# Patient Record
Sex: Female | Born: 1970 | Race: White | Hispanic: No | Smoking: Never smoker
Health system: Southern US, Community
[De-identification: ages and names within clinical notes are randomized; demographics above are authoritative.]

## PROBLEM LIST (undated history)

## (undated) DIAGNOSIS — N750 Cyst of Bartholin's gland: Secondary | ICD-10-CM

## (undated) DIAGNOSIS — I10 Essential (primary) hypertension: Secondary | ICD-10-CM

## (undated) DIAGNOSIS — K5909 Other constipation: Secondary | ICD-10-CM

## (undated) DIAGNOSIS — Z87898 Personal history of other specified conditions: Secondary | ICD-10-CM

## (undated) DIAGNOSIS — N838 Other noninflammatory disorders of ovary, fallopian tube and broad ligament: Secondary | ICD-10-CM

## (undated) DIAGNOSIS — Z973 Presence of spectacles and contact lenses: Secondary | ICD-10-CM

## (undated) DIAGNOSIS — J302 Other seasonal allergic rhinitis: Secondary | ICD-10-CM

## (undated) HISTORY — PX: BREAST BIOPSY: SHX20

## (undated) HISTORY — PX: WISDOM TOOTH EXTRACTION: SHX21

## (undated) HISTORY — DX: Essential (primary) hypertension: I10

---

## 1997-09-15 ENCOUNTER — Other Ambulatory Visit: Admission: RE | Admit: 1997-09-15 | Discharge: 1997-09-15 | Payer: Self-pay | Admitting: Gynecology

## 1998-09-20 ENCOUNTER — Other Ambulatory Visit: Admission: RE | Admit: 1998-09-20 | Discharge: 1998-09-20 | Payer: Self-pay | Admitting: Gynecology

## 1999-04-23 ENCOUNTER — Inpatient Hospital Stay (HOSPITAL_COMMUNITY): Admission: AD | Admit: 1999-04-23 | Discharge: 1999-04-23 | Payer: Self-pay | Admitting: Obstetrics & Gynecology

## 1999-07-17 ENCOUNTER — Inpatient Hospital Stay (HOSPITAL_COMMUNITY): Admission: AD | Admit: 1999-07-17 | Discharge: 1999-07-20 | Payer: Self-pay | Admitting: Obstetrics and Gynecology

## 1999-11-25 ENCOUNTER — Other Ambulatory Visit: Admission: RE | Admit: 1999-11-25 | Discharge: 1999-11-25 | Payer: Self-pay | Admitting: Obstetrics & Gynecology

## 2000-12-25 ENCOUNTER — Other Ambulatory Visit: Admission: RE | Admit: 2000-12-25 | Discharge: 2000-12-25 | Payer: Self-pay | Admitting: Obstetrics and Gynecology

## 2001-08-28 ENCOUNTER — Emergency Department (HOSPITAL_COMMUNITY): Admission: EM | Admit: 2001-08-28 | Discharge: 2001-08-28 | Payer: Self-pay

## 2001-12-27 ENCOUNTER — Other Ambulatory Visit: Admission: RE | Admit: 2001-12-27 | Discharge: 2001-12-27 | Payer: Self-pay | Admitting: Obstetrics and Gynecology

## 2003-01-12 ENCOUNTER — Other Ambulatory Visit: Admission: RE | Admit: 2003-01-12 | Discharge: 2003-01-12 | Payer: Self-pay | Admitting: Obstetrics and Gynecology

## 2003-08-27 ENCOUNTER — Ambulatory Visit (HOSPITAL_COMMUNITY): Admission: RE | Admit: 2003-08-27 | Discharge: 2003-08-27 | Payer: Self-pay | Admitting: Obstetrics and Gynecology

## 2003-09-26 ENCOUNTER — Inpatient Hospital Stay (HOSPITAL_COMMUNITY): Admission: AD | Admit: 2003-09-26 | Discharge: 2003-09-28 | Payer: Self-pay | Admitting: Obstetrics and Gynecology

## 2004-02-02 ENCOUNTER — Other Ambulatory Visit: Admission: RE | Admit: 2004-02-02 | Discharge: 2004-02-02 | Payer: Self-pay | Admitting: Obstetrics and Gynecology

## 2005-03-08 ENCOUNTER — Other Ambulatory Visit: Admission: RE | Admit: 2005-03-08 | Discharge: 2005-03-08 | Payer: Self-pay | Admitting: Obstetrics and Gynecology

## 2005-12-15 ENCOUNTER — Encounter: Admission: RE | Admit: 2005-12-15 | Discharge: 2005-12-15 | Payer: Self-pay | Admitting: Obstetrics and Gynecology

## 2008-10-15 ENCOUNTER — Encounter: Admission: RE | Admit: 2008-10-15 | Discharge: 2008-10-15 | Payer: Self-pay | Admitting: Obstetrics and Gynecology

## 2009-04-15 ENCOUNTER — Encounter: Admission: RE | Admit: 2009-04-15 | Discharge: 2009-04-15 | Payer: Self-pay | Admitting: Obstetrics and Gynecology

## 2009-11-17 ENCOUNTER — Encounter: Admission: RE | Admit: 2009-11-17 | Discharge: 2009-11-17 | Payer: Self-pay | Admitting: Obstetrics and Gynecology

## 2010-07-11 ENCOUNTER — Other Ambulatory Visit: Payer: Self-pay | Admitting: Obstetrics and Gynecology

## 2010-07-11 DIAGNOSIS — Z09 Encounter for follow-up examination after completed treatment for conditions other than malignant neoplasm: Secondary | ICD-10-CM

## 2010-09-05 ENCOUNTER — Other Ambulatory Visit: Payer: Self-pay | Admitting: Obstetrics and Gynecology

## 2010-09-05 ENCOUNTER — Ambulatory Visit
Admission: RE | Admit: 2010-09-05 | Discharge: 2010-09-05 | Disposition: A | Discharge status: Home / Self Care | Payer: BC Managed Care – PPO | Source: Ambulatory Visit | Attending: Obstetrics and Gynecology | Admitting: Obstetrics and Gynecology

## 2010-09-05 DIAGNOSIS — Z09 Encounter for follow-up examination after completed treatment for conditions other than malignant neoplasm: Secondary | ICD-10-CM

## 2010-09-05 DIAGNOSIS — Z1239 Encounter for other screening for malignant neoplasm of breast: Secondary | ICD-10-CM

## 2010-10-21 NOTE — Op Note (Signed)
[facility]  Patient:    Leslie Pittman, Leslie Pittman                         MRN: [id number] Proc. Date: 07/18/99 Adm. Date:  [id number] Attending:  Leonard Schwartz                           Operative Report  PREOPERATIVE DIAGNOSIS:       Term intrauterine pregnancy.  Arrest of second stage of labor.  Meconium stained fluid.  POSTOPERATIVE DIAGNOSIS:      Term intrauterine pregnancy.  Arrest of second stage of labor.  Meconium stained fluid.  OPERATION:                    Vacuum extraction vaginal delivery.  Repair of bilateral periurethral lacerations.  SURGEON:                      Janine Limbo, M.D.  ASSISTANT:  ANESTHESIA:                   Epidural, 1% Xylocaine.  ESTIMATED BLOOD LOSS:  INDICATIONS:                  Leslie Pittman is a 40 year old female, gravida 1, para 0, who presents at term with rupture of membranes at approximately 6:15 a.m. on  July 17, 1999.  She progressed slowly initially in her labor and her labor as augmented with Pitocin.  The patient became completely dilated and pushed for greater than 2-1/2 hours.  The presenting part was at a +2 to +3 station. Options for care were reviewed including continued pushing, forceps delivery, vacuum-assisted vaginal delivery, and cesarean delivery.  The risks and benefits of each of those options were reviewed.  The patient elected to proceed with vacuum extraction.  The specific risks of vacuum extraction were reviewed including, but not limited to, caput formation, hematoma formation, and the rare risk of intracranial bleeding.  The patient also was informed there was a possibility that vacuum extraction would be unsuccessful and that we would need to proceed with cesarean delivery.  The risks of cesarean section were reviewed including anesthetic complications, bleeding, infections, and possible damage to the surrounding organs.  FINDINGS:                     The  weight of the infant is currently not known.  female infant Leslie Pittman) was delivered with Apgars of 6 at one minute and 8 at five minutes.  There was no meconium noted beneath the vocal cords.  There was a three vessel cord present.  The placenta was normal.  There were bilateral first degree periurethral lacerations present.  There were no perineal lacerations, vaginal lacerations, or cervical lacerations.  DESCRIPTION OF PROCEDURE:     The patient was placed in the lithotomy position.  The bladder had previously been drained of urine.  The perineum, vagina, and lower abdomen were prepped with Betadine.  The patient was sterilely draped.  The Kiwi vacuum extractor was applied without difficulty.  The cervix was completely dilated.  The cervix was 100% effaced and the presenting part was at a +2 to +3  station.  The fetal head was in an occipitoanterior position.  Gentle suction was applied and the fetal head was delivered after several pushes with the mother. The mouth and nose  were suctioned using the DeLee trap and the bulb suction.  No meconium was noted.  The remainder of the infant was delivered and the infant was handed to the awaiting pediatric team.  They checked the vocal cords and there as no meconium noted.  Routine cord blood studies were obtained.  The placenta was  removed.  The lacerations were injected with 1% Xylocaine and then the lacerations were closed using figure-of-eight sutures of 0 chromic.  The upper vagina was inspected and no lacerations were noted.  Hemostasis was noted to be adequate. The infant was allowed to remain in the room with the parents to bond. DD:  07/18/99 TD:  07/18/99 Job: [id number]

## 2010-10-21 NOTE — H&P (Signed)
NAME:  Leslie Pittman, Leslie Pittman                          ACCOUNT NO.:  [id number]   MEDICAL RECORD NO.:  [id number]                   PATIENT TYPE:  [facility]   LOCATION:  [facility]                                 FACILITY:  [facility]   PHYSICIAN:  Leslie Pittman, M.D.             DATE OF BIRTH:  27-Jul-1970   DATE OF ADMISSION:  09/26/2003  DATE OF DISCHARGE:                                HISTORY & PHYSICAL   HISTORY OF PRESENT ILLNESS:  Leslie Pittman is a 40 year old gravida 2 para 1-  0-0-1 at 33 and six-sevenths weeks who presents with spontaneous rupture of  membranes at approximately 2:30 a.m. with clear and pink-tinged fluid noted.  Uterine contractions have been irregular and mild since.  The patient  reports positive fetal movement and denies headache, visual symptoms, or  epigastric pain.  Pregnancy has been remarkable for:  1. Chronic hypertension with the patient on Aldomet 125 mg b.i.d. throughout     her pregnancy with good control.  2. Positive group B strep sensitive to clindamycin.  3. PENICILLIN allergy.  4. Small frame.   PRENATAL LABORATORY DATA:  Blood type is O positive, Rh antibody negative.  VDRL nonreactive.  Rubella titer positive.  Hepatitis B surface antigen  negative.  HIV nonreactive.  GC and chlamydia cultures were negative.  Pap  was normal.  Glucose challenge was normal.  Hemoglobin upon entering the  practice was 13.4; it was 12.4 at 26 weeks.  Quadruple screen was normal.  Group B strep culture was positive at 36 weeks.  GC and chlamydia cultures  were negative.  Glucola was normal.  EDC of September 27, 2003 was established  by last menstrual period and was in agreement with ultrasound at  approximately 18 weeks.   HISTORY OF PRESENT PREGNANCY:  The patient entered care at approximately 10  weeks.  She was on Aldomet 125 b.i.d. at that time.  She had an ultrasound  at 18 weeks that showed normal growth and development.  She had a normal  AFP.  She also had a normal Glucola.   She had lower right-sided pain at 29  weeks.  This ws evaluated and felt to be musculoskeletal.  She continued on  her Aldomet at the same dose throughout her pregnancy with good control of  her blood pressure.  She began NSTs at 32 weeks biweekly and consistently  had reactive tracings.  Beta strep was done at 36 weeks and sensitivities  were determined secondary to the patient's history of PENICILLIN allergy.  The beta strep was sensitive to penicillin and clindamycin, was intermediate  to erythromycin.  She did have a variable at 34 weeks and had a BPP that was  normal.  She had another ultrasound at 35 weeks for size less than dates  that showed normal growth and development.  The rest of her pregnancy was  uncomplicated.  She was scheduled for induction on the  evening of September 28, 2003 secondary to chronic hypertension.   OBSTETRICAL HISTORY:  In 2001 she had a vaginal delivery with a vacuum  assist of a female infant, weight 7 pounds 13 ounces, at 40 weeks.  She was  in labor 21 hours.  She had epidural anesthesia.  Dr. Stefano Pittman did deliver  that child.  She had elevated blood pressure the last week of that pregnancy  and her blood pressure issue did continue.   MEDICAL HISTORY:  She was on oral contraceptives in the past but was stopped  1 year ago secondary to elevated blood pressure.  Her blood pressure was  diagnosed in the last part of her pregnancy and just has continued to be  mildly elevated.  She did have the usual childhood illnesses.  She has had  one UTI.  She was hospitalized for a reaction to SULFA as a child; she was  given that for fever.  She is allergic to PENICILLIN and SULFA.   FAMILY HISTORY:  Paternal uncles had heart disease and hypertension.  Her  maternal grandmother was on medication.  Maternal grandfather is deceased  from emphysema.  Mother is hypothyroid.   GENETIC HISTORY:  Remarkable for the father-of-the-baby's sister having  multiple  sclerosis.   SOCIAL HISTORY:  The patient is married to the father of the baby.  He is  involved and supportive.  His name is Leslie Pittman.  The patient is Caucasian  of the Catholic faith.  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  Her husband is college educated and he is employed as an Health and safety inspector.  She has been followed by the physician service at [facility]  She  denies any alcohol, drug, or tobacco use during this pregnancy.   PHYSICAL EXAMINATION:  VITAL SIGNS:  Blood pressures on admission were  136/87; other vital signs are stable.  HEENT:  Within normal limits.  LUNGS:  Bilateral breath sounds are clear.  HEART:  Regular rate and rhythm without murmur.  BREASTS:  Soft and nontender.  ABDOMEN:  Fundal height is approximately 38 cm.  Estimated fetal weight is 6  to 6.5 pounds.  Uterine contractions are every 2-6 minutes, mild to moderate  quality.  PELVIC:  Sterile speculum exam reveals positive pooling, positive nitrazine,  positive fern.  Cervix is 3 cm, 75%, vertex, at a -1 station.  Fetal heart  rate monitor shows reactive tracing.  There are currently no decelerations.  There was a mild variable associated with one contraction; this did not  reoccur.  Fluid is slightly pink tinged.  EXTREMITIES:  Deep tendon reflexes are 2+ without clonus.  There is a trace  edema noted.   IMPRESSION:  1. Intrauterine pregnancy at 45 and six-sevenths weeks.  2. Spontaneous rupture of membranes in early labor.  3. Positive group B streptococcus with PENICILLIN allergy but group B     streptococcus sensitive to clindamycin.   PLAN:  1. Admit to birthing suite per consult with Dr. Pennie Pittman as attending     physician.  2. Routine physician orders.  3. Plan group B strep prophylaxis with clindamycin.  4. The patient will plan epidural as labor progresses.     Leslie Pittman, C.N.M.                   Leslie Pittman, M.D.   Leslie Pittman  D:  09/26/2003  T:   09/26/2003  Job:  [id number]

## 2010-10-21 NOTE — Discharge Summary (Signed)
[facility]  Patient:    VETRA, SHINALL                         MRN: [id number] Adm. Date:  [id number] Disc. Date: [id number] Attending:  Leonard Schwartz Dictator:   Miguel Dibble, C.N.M.                           Discharge Summary  ADMISSION DIAGNOSES:          1. Intrauterine pregnancy at term.                               2. Primigravida.                               3. Rupture of membranes.                               4. Hypertension in the third trimester.                               5. Positive Group B Strep, allergic to penicillin.                               6. Small frame.  DISCHARGE DIAGNOSES:          1. Intrauterine pregnancy at term.                               2. Primigravida.                               3. Rupture of membranes.                               4. Hypertension in the third trimester.                               5. Positive Group B Strep, allergic to penicillin.                               6. Small frame.                               7. Delivered viable babygirl, Apgars 6 and 8,                                  weight 7 pounds 13 ounces by vacuum extraction.                               8. Meconium stained fluid.  9. Arrested second stage of labor.  PROCEDURE:                    Repair of first degree periurethral laceration, vacuum extraction, Pitocin augmentation, internal fetal monitoring, and amniotomy.  HOSPITAL COURSE:              On July 17, 1999, at approximately 9:76 a.m.,  40 year old Chyenne Sobczak presented with spontaneous rupture of membranes at approximately 6:15 a.m. that was initially pink-tinged, appeared to be fairly clear.  Her blood pressure at admission was 151/88.  She had no symptoms of preeclampsia.  She had had increasing blood pressures in her third trimester. y 10:20 a.m. her blood pressure was 140/94.  An amniotomy was performed at  2:25 p.m. and her cervix was 2 cm, 75% effaced, and vertex at -3.  An amnioinfusion was performed for meconium stained fluid.  Her urinalysis that was performed earlier proved to be negative for protein.  Her blood pressures decreased to 131/77, platelets 214,000.  Her [place] units were only 170.  Pitocin was initiated o augment her labor.  Cervix progressed to 5 cm, 80% effaced by 9:45 p.m. However, she was having a dysfunctional pattern.  At 5 a.m. she delivered by vacuum extraction with arrest of second stage of labor under epidural anesthesia, a viable babygirl, Megan, weighing 7 pounds 13 ounces.  She had first degree bilateral periurethral lacerations that were repaired without difficulty.  On postpartum ay #1, she was breastfeeding well.  Hemoglobin dropped from 14.4 to 9.9, white count was 22.4.  She was afebrile, abdomen soft and nontender, fundus firm 1 below the umbilicus.  She had been treated for anemia during her pregnancy and had iron at home that she would continue.  On her postpartum day #2, she continued to breastfeed well.  Hemoglobin dropped to 8.6.  White count decreased from 22.4 to 19.4.  She was afebrile.  For the previous 24 hours, she was discharged home in  stable condition.  DISCHARGE INSTRUCTIONS:       Per [facility] and Gynecology booklet.  DISCHARGE MEDICATIONS:        1. Iron twice per day.                               2. Prenatal vitamins.                               3. Micronor.                               4. Tylox.                               5. Motrin.  FOLLOW-UP:                    Discharge follow-up in six weeks at [facility] and Gynecology.  DISCHARGE INSTRUCTIONS:       Per [facility] and Gynecology booklet. DD:  07/20/99 TD:  07/20/99 Job: [id number]

## 2010-11-07 ENCOUNTER — Ambulatory Visit
Admission: RE | Admit: 2010-11-07 | Discharge: 2010-11-07 | Disposition: A | Discharge status: Home / Self Care | Payer: BC Managed Care – PPO | Source: Ambulatory Visit | Attending: Obstetrics and Gynecology | Admitting: Obstetrics and Gynecology

## 2010-11-07 DIAGNOSIS — Z1239 Encounter for other screening for malignant neoplasm of breast: Secondary | ICD-10-CM

## 2010-11-08 ENCOUNTER — Other Ambulatory Visit: Payer: Self-pay | Admitting: Obstetrics and Gynecology

## 2010-11-08 DIAGNOSIS — R928 Other abnormal and inconclusive findings on diagnostic imaging of breast: Secondary | ICD-10-CM

## 2010-11-10 ENCOUNTER — Ambulatory Visit
Admission: RE | Admit: 2010-11-10 | Discharge: 2010-11-10 | Disposition: A | Discharge status: Home / Self Care | Payer: BC Managed Care – PPO | Source: Ambulatory Visit | Attending: Obstetrics and Gynecology | Admitting: Obstetrics and Gynecology

## 2010-11-10 DIAGNOSIS — R928 Other abnormal and inconclusive findings on diagnostic imaging of breast: Secondary | ICD-10-CM

## 2011-05-25 ENCOUNTER — Other Ambulatory Visit: Payer: Self-pay | Admitting: *Deleted

## 2011-05-25 ENCOUNTER — Other Ambulatory Visit: Payer: Self-pay | Admitting: Obstetrics and Gynecology

## 2011-05-25 DIAGNOSIS — R921 Mammographic calcification found on diagnostic imaging of breast: Secondary | ICD-10-CM

## 2011-05-25 DIAGNOSIS — Z09 Encounter for follow-up examination after completed treatment for conditions other than malignant neoplasm: Secondary | ICD-10-CM

## 2011-06-12 ENCOUNTER — Other Ambulatory Visit: Payer: Self-pay | Admitting: Obstetrics and Gynecology

## 2011-06-12 ENCOUNTER — Ambulatory Visit
Admission: RE | Admit: 2011-06-12 | Discharge: 2011-06-12 | Disposition: A | Discharge status: Home / Self Care | Payer: BC Managed Care – PPO | Source: Ambulatory Visit | Attending: Obstetrics and Gynecology | Admitting: Obstetrics and Gynecology

## 2011-06-12 DIAGNOSIS — Z09 Encounter for follow-up examination after completed treatment for conditions other than malignant neoplasm: Secondary | ICD-10-CM

## 2011-06-12 DIAGNOSIS — R921 Mammographic calcification found on diagnostic imaging of breast: Secondary | ICD-10-CM

## 2011-06-13 ENCOUNTER — Ambulatory Visit
Admission: RE | Admit: 2011-06-13 | Discharge: 2011-06-13 | Disposition: A | Discharge status: Home / Self Care | Payer: BC Managed Care – PPO | Source: Ambulatory Visit | Attending: Obstetrics and Gynecology | Admitting: Obstetrics and Gynecology

## 2011-06-13 ENCOUNTER — Other Ambulatory Visit: Payer: Self-pay | Admitting: Obstetrics and Gynecology

## 2011-06-13 DIAGNOSIS — R921 Mammographic calcification found on diagnostic imaging of breast: Secondary | ICD-10-CM

## 2011-06-13 DIAGNOSIS — Z09 Encounter for follow-up examination after completed treatment for conditions other than malignant neoplasm: Secondary | ICD-10-CM

## 2011-08-11 ENCOUNTER — Ambulatory Visit (INDEPENDENT_AMBULATORY_CARE_PROVIDER_SITE_OTHER): Payer: BC Managed Care – PPO | Admitting: Internal Medicine

## 2011-08-11 DIAGNOSIS — J329 Chronic sinusitis, unspecified: Secondary | ICD-10-CM

## 2011-08-11 DIAGNOSIS — R05 Cough: Secondary | ICD-10-CM

## 2011-08-11 DIAGNOSIS — R059 Cough, unspecified: Secondary | ICD-10-CM

## 2011-08-11 MED ORDER — BENZONATATE 100 MG PO CAPS: 100.0000 mg | ORAL_CAPSULE | Freq: Two times a day (BID) | ORAL | Status: AC | PRN

## 2011-08-11 MED ORDER — AZITHROMYCIN 500 MG PO TABS: 500.0000 mg | ORAL_TABLET | Freq: Every day | ORAL | Status: AC

## 2011-08-11 MED ORDER — HYDROCOD POLST-CHLORPHEN POLST 10-8 MG/5ML PO LQCR: 5.0000 mL | Freq: Two times a day (BID) | ORAL | Status: DC

## 2011-08-11 NOTE — Progress Notes (Signed)
  Subjective:    Patient ID: Leslie Pittman, female    DOB: 1970/07/31, 41 y.o.   MRN: [id number]  HPI About 3 weeks ago she developed a bad cold with fever. As she got well she began to cough and have morning hoarseness. She also has morning nosebleeds. Now the cough is so bad that she can't sleep at night for the last week and she coughs in paroxysms that were. No fever or night sweats. Her cough is nonproductive.   Review of SystemsOn amlodipine for hypertension     Objective:   Physical ExamVital signs normal TMs clear Nares with bleeding points on both septum and purulent discharge Tender maxillary areas Throat is clear No cervical adenopathy         Assessment & Plan:  Problem #1 sinusitisWith cough Zithromax 500 daily for 5 days Tussionex suspension Tessalon Perles If not well in 10 days recheck

## 2012-06-25 ENCOUNTER — Ambulatory Visit: Payer: BC Managed Care – PPO | Admitting: Obstetrics and Gynecology

## 2012-06-25 ENCOUNTER — Encounter: Payer: Self-pay | Admitting: Obstetrics and Gynecology

## 2012-06-25 VITALS — BP 128/78 | HR 84 | Ht 61.0 in | Wt 122.0 lb

## 2012-06-25 DIAGNOSIS — Z124 Encounter for screening for malignant neoplasm of cervix: Secondary | ICD-10-CM

## 2012-06-25 DIAGNOSIS — N63 Unspecified lump in unspecified breast: Secondary | ICD-10-CM

## 2012-06-25 DIAGNOSIS — Z01419 Encounter for gynecological examination (general) (routine) without abnormal findings: Secondary | ICD-10-CM

## 2012-06-25 NOTE — Progress Notes (Signed)
Subjective:    Leslie Pittman is a 42 y.o. female, G2P0, who presents for an annual exam. The patient reports no complaints. Menstrual cycle:   LMP: Patient's last menstrual period was 06/15/2012.             Review of Systems Pertinent items are noted in HPI. Denies pelvic pain, urinary tract symptoms, vaginitis symptoms, irregular bleeding, menopausal symptoms, change in bowel habits or rectal bleeding   Objective:    BP 128/78  Pulse 84  Ht 5\' 1"  (1.549 m)  Wt 122 lb (55.339 kg)  BMI 23.05 kg/m2  LMP 06/15/2012    Wt Readings from Last 1 Encounters:  06/25/12 122 lb (55.339 kg)   Body mass index is 23.05 kg/(m^2). General Appearance: Alert, no acute distress HEENT: Grossly normal Neck / Thyroid: Supple, no thyromegaly or cervical adenopathy Lungs: Clear to auscultation bilaterally Back: No CVA tenderness Breast Exam: Left Breast nodule, 1 cm x 1 cm mobile, non-tender at 6 o'clock of areola, .No dimpling, nipple retraction or discharge. Cardiovascular: Regular rate and rhythm.  Gastrointestinal: Soft, non-tender, no masses or organomegaly Pelvic Exam: EGBUS-wnl, vagina-normal rugae, cervix- without lesions or tenderness, uterus appears normal size shape and consistency, adnexae-no masses or tenderness Rectovaginal: no masses and normal sphincter tone Lymphatic Exam: Non-palpable nodes in neck, clavicular,  axillary, or inguinal regions  Skin: no rashes or abnormalities Extremities: no clubbing cyanosis or edema  Neurologic: grossly normal Psychiatric: Alert and oriented  Assessment:   Routine GYN Exam Left Breast Nodule S/P Left Breast (UOQ) Biopsy-Fibrocystic Changes Plan:  Diagnostic MG/ultrsound  of left breast   PAP sent  RTO 1 year or prn  Junice Fei,ELMIRAPA-C

## 2012-06-25 NOTE — Progress Notes (Signed)
Regular Periods: yes  Mammogram: yes  Monthly Breast Ex.: no Exercise: yes  Tetanus < 10 years: yes Seatbelts: yes  NI. Bladder Functn.: yes Abuse at home: no  Daily BM's: no Stressful Work: yes  Healthy Diet: yes Sigmoid-Colonoscopy: no  Calcium: no Medical problems this year: none   LAST PAP:2012  Contraception:  spouse had vas  Mammogram:  2/13  nl  PCP: DR. Azucena Cecil  PMH: NO CHANGE  FMH: NO CHANGE  Last Bone Scan: NO  PT IS MARRIED.

## 2012-06-26 LAB — PAP IG W/ RFLX HPV ASCU

## 2012-07-04 ENCOUNTER — Ambulatory Visit
Admission: RE | Admit: 2012-07-04 | Discharge: 2012-07-04 | Disposition: A | Discharge status: Home / Self Care | Payer: BC Managed Care – PPO | Source: Ambulatory Visit | Attending: Obstetrics and Gynecology | Admitting: Obstetrics and Gynecology

## 2012-07-04 DIAGNOSIS — N63 Unspecified lump in unspecified breast: Secondary | ICD-10-CM

## 2014-04-06 ENCOUNTER — Encounter: Payer: Self-pay | Admitting: Obstetrics and Gynecology

## 2014-11-11 ENCOUNTER — Other Ambulatory Visit: Payer: Self-pay

## 2014-11-11 DIAGNOSIS — Z1231 Encounter for screening mammogram for malignant neoplasm of breast: Secondary | ICD-10-CM

## 2014-12-08 ENCOUNTER — Ambulatory Visit: Payer: BC Managed Care – PPO

## 2014-12-21 ENCOUNTER — Other Ambulatory Visit: Payer: Self-pay | Admitting: Obstetrics and Gynecology

## 2014-12-21 DIAGNOSIS — R928 Other abnormal and inconclusive findings on diagnostic imaging of breast: Secondary | ICD-10-CM

## 2014-12-25 ENCOUNTER — Ambulatory Visit
Admission: RE | Admit: 2014-12-25 | Discharge: 2014-12-25 | Disposition: A | Discharge status: Home / Self Care | Payer: BC Managed Care – PPO | Source: Ambulatory Visit | Attending: Obstetrics and Gynecology | Admitting: Obstetrics and Gynecology

## 2014-12-25 DIAGNOSIS — R928 Other abnormal and inconclusive findings on diagnostic imaging of breast: Secondary | ICD-10-CM

## 2015-11-22 ENCOUNTER — Other Ambulatory Visit: Payer: Self-pay | Admitting: Orthopedic Surgery

## 2015-11-22 DIAGNOSIS — M5416 Radiculopathy, lumbar region: Secondary | ICD-10-CM

## 2015-11-30 ENCOUNTER — Ambulatory Visit
Admission: RE | Admit: 2015-11-30 | Discharge: 2015-11-30 | Disposition: A | Discharge status: Home / Self Care | Payer: BC Managed Care – PPO | Source: Ambulatory Visit | Attending: Orthopedic Surgery | Admitting: Orthopedic Surgery

## 2015-11-30 DIAGNOSIS — M5416 Radiculopathy, lumbar region: Secondary | ICD-10-CM

## 2015-11-30 MED ORDER — IOPAMIDOL (ISOVUE-M 200) INJECTION 41%
1.0000 mL | Freq: Once | INTRAMUSCULAR | Status: AC
  Administered 2015-11-30: 1 mL via EPIDURAL

## 2015-11-30 MED ORDER — METHYLPREDNISOLONE ACETATE 40 MG/ML INJ SUSP (RADIOLOG
120.0000 mg | Freq: Once | INTRAMUSCULAR | Status: AC
  Administered 2015-11-30: 120 mg via EPIDURAL

## 2015-11-30 NOTE — Discharge Instructions (Signed)

## 2016-03-20 ENCOUNTER — Other Ambulatory Visit: Payer: Self-pay

## 2016-03-20 DIAGNOSIS — Z1231 Encounter for screening mammogram for malignant neoplasm of breast: Secondary | ICD-10-CM

## 2016-03-22 ENCOUNTER — Other Ambulatory Visit: Payer: Self-pay | Admitting: Obstetrics and Gynecology

## 2016-03-22 DIAGNOSIS — N6322 Unspecified lump in the left breast, upper inner quadrant: Secondary | ICD-10-CM

## 2016-03-29 ENCOUNTER — Other Ambulatory Visit: Payer: BC Managed Care – PPO

## 2016-03-30 ENCOUNTER — Other Ambulatory Visit: Payer: Self-pay | Admitting: Obstetrics and Gynecology

## 2016-03-30 ENCOUNTER — Ambulatory Visit
Admission: RE | Admit: 2016-03-30 | Discharge: 2016-03-30 | Disposition: A | Discharge status: Home / Self Care | Payer: BC Managed Care – PPO | Source: Ambulatory Visit | Attending: Obstetrics and Gynecology | Admitting: Obstetrics and Gynecology

## 2016-03-30 DIAGNOSIS — N6322 Unspecified lump in the left breast, upper inner quadrant: Secondary | ICD-10-CM

## 2018-03-04 ENCOUNTER — Other Ambulatory Visit: Payer: Self-pay | Admitting: Obstetrics and Gynecology

## 2018-03-04 DIAGNOSIS — Z1231 Encounter for screening mammogram for malignant neoplasm of breast: Secondary | ICD-10-CM

## 2018-04-02 ENCOUNTER — Other Ambulatory Visit: Payer: Self-pay | Admitting: Obstetrics and Gynecology

## 2018-04-02 ENCOUNTER — Ambulatory Visit
Admission: RE | Admit: 2018-04-02 | Discharge: 2018-04-02 | Disposition: A | Discharge status: Home / Self Care | Payer: BC Managed Care – PPO | Source: Ambulatory Visit | Attending: Obstetrics and Gynecology | Admitting: Obstetrics and Gynecology

## 2018-04-02 DIAGNOSIS — Z1231 Encounter for screening mammogram for malignant neoplasm of breast: Secondary | ICD-10-CM

## 2018-05-31 ENCOUNTER — Ambulatory Visit: Payer: BC Managed Care – PPO | Admitting: Plastic Surgery

## 2018-05-31 ENCOUNTER — Encounter: Payer: Self-pay | Admitting: Plastic Surgery

## 2018-05-31 DIAGNOSIS — L989 Disorder of the skin and subcutaneous tissue, unspecified: Secondary | ICD-10-CM

## 2018-05-31 NOTE — Progress Notes (Signed)
     Patient ID: Leslie Pittman, female    DOB: 1970-08-03, 47 y.o.   MRN: [id number]   Chief Complaint  Patient presents with  . Skin Problem    The patient is a 47 yrs old wf here for evaluation of a changing skin lesion of her chin. There are two lesions.  One is 7 mm in the center of her chin that is raised and flesh in color.  The other is 5 mm, slightly raised and on the left of center.  They have been present for years but seem to be getting larger.  The larger one has a slight translucent look to it.  She has not had skin cancer in the past. There is a family history of skin cancer in her dad.  She is otherwise in good health.   Review of Systems  Constitutional: Negative.   HENT: Negative.   Eyes: Negative.   Respiratory: Negative.   Cardiovascular: Negative.   Gastrointestinal: Negative.   Endocrine: Negative.   Genitourinary: Negative.   Musculoskeletal: Negative.   Skin: Negative.  Negative for color change and wound.  Hematological: Negative.   Psychiatric/Behavioral: Negative.     Past Medical History:  Diagnosis Date  . Hypertension     Past Surgical History:  Procedure Laterality Date  . BREAST BIOPSY Left   . WISDOM TOOTH EXTRACTION        Current Outpatient Medications:  .  amLODipine (NORVASC) 10 MG tablet, Take 10 mg by mouth daily., Disp: , Rfl:    Objective:   Vitals:   05/31/18 0937  BP: 128/79  Pulse: 71  Temp: 98.1 F (36.7 C)  SpO2: 100%    Physical Exam Vitals signs and nursing note reviewed.  Constitutional:      Appearance: Normal appearance.  HENT:     Head: Normocephalic and atraumatic.     Mouth/Throat:     Mouth: Mucous membranes are moist.  Cardiovascular:     Rate and Rhythm: Normal rate.  Pulmonary:     Effort: Pulmonary effort is normal.  Abdominal:     General: Abdomen is flat. There is no distension.     Tenderness: There is no abdominal tenderness.  Neurological:     Mental Status: She is alert.  Psychiatric:         Mood and Affect: Mood normal.        Thought Content: Thought content normal.        Judgment: Judgment normal.    Assessment & Plan:  Changing skin lesion  Recommed excision of changing skin lesion of chin. We discussed the scar.    [place], DO

## 2018-07-24 ENCOUNTER — Encounter: Payer: Self-pay | Admitting: Internal Medicine

## 2018-08-06 ENCOUNTER — Other Ambulatory Visit: Payer: Self-pay | Admitting: Gynecologic Oncology

## 2018-08-06 ENCOUNTER — Encounter: Payer: Self-pay | Admitting: Gynecologic Oncology

## 2018-08-06 ENCOUNTER — Inpatient Hospital Stay: Payer: BC Managed Care – PPO | Attending: Gynecologic Oncology | Admitting: Gynecologic Oncology

## 2018-08-06 VITALS — BP 147/78 | HR 86 | Temp 98.2°F | Resp 18 | Ht 62.0 in | Wt 113.9 lb

## 2018-08-06 DIAGNOSIS — D3912 Neoplasm of uncertain behavior of left ovary: Secondary | ICD-10-CM | POA: Diagnosis not present

## 2018-08-06 DIAGNOSIS — N838 Other noninflammatory disorders of ovary, fallopian tube and broad ligament: Secondary | ICD-10-CM

## 2018-08-06 DIAGNOSIS — G8918 Other acute postprocedural pain: Secondary | ICD-10-CM

## 2018-08-06 MED ORDER — IBUPROFEN 600 MG PO TABS: 600.0000 mg | ORAL_TABLET | Freq: Four times a day (QID) | ORAL | 0 refills | Status: DC | PRN

## 2018-08-06 MED ORDER — SENNOSIDES-DOCUSATE SODIUM 8.6-50 MG PO TABS: 2.0000 | ORAL_TABLET | Freq: Every day | ORAL | 1 refills | Status: DC

## 2018-08-06 MED ORDER — OXYCODONE HCL 5 MG PO TABS: 5.0000 mg | ORAL_TABLET | ORAL | 0 refills | Status: DC | PRN

## 2018-08-06 NOTE — Progress Notes (Signed)
Consult Note: Gyn-Onc  Consult was requested by Dr. Royston Sinner for the evaluation of Leslie Pittman 48 y.o. female  CC:  Chief Complaint  Patient presents with  . Ovarian mass, left    Assessment/Plan:  Leslie Pittman  is a 48 y.o.  year old with a left sided complex ovarian cyst.  She desires permanent sterility.  I discussed with the patient that there is a high likelihood that this ovarian mass is benign, however given its size and complex appearance on ultrasound, it is reasonable to proceed with surgical evaluation and frozen section.  This would involve robotically assisted left salpingo-oophorectomy possible hysterectomy possible staging.  The patient is also interested in permanent sterilization and therefore we will perform a contralateral right salpingectomy at the time of surgery.  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  I discussed surgical risks with the patient including  bleeding, infection, damage to internal organs (such as bladder,ureters, bowels), blood clot, reoperation and rehospitalization.  I discussed anticipated recovery and hospital stay.  The patient is in agreement with the plan and has surgery scheduled for August 15, 2018.   HPI: Leslie Pittman is a 48 year old P2 who is seen in consultation at the request of Dr Royston Sinner for a complex left ovarian cyst.  The patient was being evaluated by Dr. Royston Sinner for abnormal uterine bleeding.  She had a period of amenorrhea for 2 months followed by an episode of menorrhagia.  This prompted a transvaginal ultrasound which was performed on Fabry 27th 2020.  This revealed a uterus measuring 7.9 x 4 x 5.8 cm with a 2.7 intramural fibroids seen.  There were no right adnexal masses seen.  The left ovary contained a 5.5 x 4.4 x 4.4 cm complex mass with thick septations.  It contained low-level echoes and a couple of solid areas with the largest solid area being 2.2 cm.  A small amount of blood flow  was seen within the cyst.  No free fluid was seen.  Endometrial thickening was seen on the anterior wall of the endometrial cavity following saline infusion into the endometrium.  An endometrial biopsy was performed on Fabry 19 2020 and this revealed proliferative endometrium and benign endocervical mucosa.  A cervical polyp was also biopsy that was benign.  The patient was started on medroxyprogesterone which stopped her bleeding which was only spotting.  A Ca1 25 was drawn in the office on August 01, 2018.  This is not resulted at this time.  The patient is otherwise very healthy.  She has had 2 prior vaginal deliveries and no abdominal surgeries.  Current Meds:  Outpatient Encounter Medications as of 08/06/2018  Medication Sig  . amLODipine (NORVASC) 10 MG tablet Take 10 mg by mouth daily.  . medroxyPROGESTERone (PROVERA) 10 MG tablet Take 10 mg by mouth daily. 4 days left of Taper  . ibuprofen (ADVIL,MOTRIN) 600 MG tablet Take 1 tablet (600 mg total) by mouth every 6 (six) hours as needed for moderate pain. For AFTER surgery  . oxyCODONE (OXY IR/ROXICODONE) 5 MG immediate release tablet Take 1 tablet (5 mg total) by mouth every 4 (four) hours as needed for severe pain. For AFTER surgery, do not take and drive  . senna-docusate (SENOKOT-S) 8.6-50 MG tablet Take 2 tablets by mouth at bedtime. For AFTER surgery, do not take if having loose stools   No facility-administered encounter medications on file as of 08/06/2018.     Allergy:  Allergies  Allergen Reactions  . Sulfa Antibiotics Anaphylaxis and Rash  . Penicillins     Social Hx:   Social History   Socioeconomic History  . Marital status: Married    Spouse name: Not on file  . Number of children: Not on file  . Years of education: Not on file  . Highest education level: Not on file  Occupational History  . Not on file  Social Needs  . Financial resource strain: Not on file  . Food insecurity:    Worry: Not on file     Inability: Not on file  . Transportation needs:    Medical: Not on file    Non-medical: Not on file  Tobacco Use  . Smoking status: Never Smoker  . Smokeless tobacco: Never Used  Substance and Sexual Activity  . Alcohol use: No  . Drug use: No  . Sexual activity: Yes    Birth control/protection: Surgical    Comment: spouse had vas  Lifestyle  . Physical activity:    Days per week: Not on file    Minutes per session: Not on file  . Stress: Not on file  Relationships  . Social connections:    Talks on phone: Not on file    Gets together: Not on file    Attends religious service: Not on file    Active member of club or organization: Not on file    Attends meetings of clubs or organizations: Not on file    Relationship status: Not on file  . Intimate partner violence:    Fear of current or ex partner: Not on file    Emotionally abused: Not on file    Physically abused: Not on file    Forced sexual activity: Not on file  Other Topics Concern  . Not on file  Social History Narrative  . Not on file    Past Surgical Hx:  Past Surgical History:  Procedure Laterality Date  . BREAST BIOPSY Left   . WISDOM TOOTH EXTRACTION      Past Medical Hx:  Past Medical History:  Diagnosis Date  . Hypertension     Past Gynecological History:  SVD x 2. No LMP recorded.  Family Hx:  Family History  Problem Relation Age of Onset  . Thyroid disease Mother   . Hypertension Father   . Hyperlipidemia Father   . Heart attack Paternal Uncle   . Hypertension Maternal Uncle   . Hypertension Maternal Grandmother   . Heart disease Paternal Uncle   . Hypertension Paternal Uncle   . Colon cancer Paternal Grandmother     Review of Systems:  Constitutional  Feels well,    ENT Normal appearing ears and nares bilaterally Skin/Breast  No rash, sores, jaundice, itching, dryness Cardiovascular  No chest pain, shortness of breath, or edema  Pulmonary  No cough or wheeze.  Gastro  Intestinal  No nausea, vomitting, or diarrhoea. No bright red blood per rectum, no abdominal pain, change in bowel movement, or constipation.  Genito Urinary  No frequency, urgency, dysuria, + irregular vaginal bleeding/perimenopausal Musculo Skeletal  No myalgia, arthralgia, joint swelling or pain  Neurologic  No weakness, numbness, change in gait,  Psychology  No depression, anxiety, insomnia.   Vitals:  Blood pressure (!) 147/78, pulse 86, temperature 98.2 F (36.8 C), resp. rate 18, height 5\' 2"  (1.575 m), weight 113 lb 14.4 oz (51.7 kg), SpO2 100 %.  Physical Exam: WD in NAD Neck  Supple NROM, without any enlargements.  Lymph Node Survey No cervical supraclavicular or inguinal adenopathy Cardiovascular  Pulse normal rate, regularity and rhythm. S1 and S2 normal.  Lungs  Clear to auscultation bilateraly, without wheezes/crackles/rhonchi. Good air movement.  Skin  No rash/lesions/breakdown  Psychiatry  Alert and oriented to person, place, and time  Abdomen  Normoactive bowel sounds, abdomen soft, non-tender and thin without evidence of hernia.  Back No CVA tenderness Genito Urinary  Vulva/vagina: Normal external female genitalia.   No lesions. No discharge or bleeding.  Bladder/urethra:  No lesions or masses, well supported bladder  Vagina: norma  Cervix: Normal appearing, no lesions.  Uterus:  Small, mobile, no parametrial involvement or nodularity.  Adnexa: Fullness, mobile, smooth mass in left adnexa. Rectal  Good tone, no masses no cul de sac nodularity.  Extremities  No bilateral cyanosis, clubbing or edema.   Thereasa Solo, MD  08/06/2018, 3:19 PM

## 2018-08-06 NOTE — H&P (View-Only) (Signed)
Consult Note: Gyn-Onc  Consult was requested by Dr. Royston Sinner for the evaluation of Leslie Pittman 48 y.o. female  CC:  Chief Complaint  Patient presents with  . Ovarian mass, left    Assessment/Plan:  Ms. Leslie Pittman  is a 48 y.o.  year old with a left sided complex ovarian cyst.  She desires permanent sterility.  I discussed with the patient that there is a high likelihood that this ovarian mass is benign, however given its size and complex appearance on ultrasound, it is reasonable to proceed with surgical evaluation and frozen section.  This would involve robotically assisted left salpingo-oophorectomy possible hysterectomy possible staging.  The patient is also interested in permanent sterilization and therefore we will perform a contralateral right salpingectomy at the time of surgery.  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  I discussed surgical risks with the patient including  bleeding, infection, damage to internal organs (such as bladder,ureters, bowels), blood clot, reoperation and rehospitalization.  I discussed anticipated recovery and hospital stay.  The patient is in agreement with the plan and has surgery scheduled for August 15, 2018.   HPI: Leslie Pittman is a 48 year old P2 who is seen in consultation at the request of Dr Royston Sinner for a complex left ovarian cyst.  The patient was being evaluated by Dr. Royston Sinner for abnormal uterine bleeding.  She had a period of amenorrhea for 2 months followed by an episode of menorrhagia.  This prompted a transvaginal ultrasound which was performed on Fabry 27th 2020.  This revealed a uterus measuring 7.9 x 4 x 5.8 cm with a 2.7 intramural fibroids seen.  There were no right adnexal masses seen.  The left ovary contained a 5.5 x 4.4 x 4.4 cm complex mass with thick septations.  It contained low-level echoes and a couple of solid areas with the largest solid area being 2.2 cm.  A small amount of blood flow  was seen within the cyst.  No free fluid was seen.  Endometrial thickening was seen on the anterior wall of the endometrial cavity following saline infusion into the endometrium.  An endometrial biopsy was performed on Fabry 19 2020 and this revealed proliferative endometrium and benign endocervical mucosa.  A cervical polyp was also biopsy that was benign.  The patient was started on medroxyprogesterone which stopped her bleeding which was only spotting.  A Ca1 25 was drawn in the office on August 01, 2018.  This is not resulted at this time.  The patient is otherwise very healthy.  She has had 2 prior vaginal deliveries and no abdominal surgeries.  Current Meds:  Outpatient Encounter Medications as of 08/06/2018  Medication Sig  . amLODipine (NORVASC) 10 MG tablet Take 10 mg by mouth daily.  . medroxyPROGESTERone (PROVERA) 10 MG tablet Take 10 mg by mouth daily. 4 days left of Taper  . ibuprofen (ADVIL,MOTRIN) 600 MG tablet Take 1 tablet (600 mg total) by mouth every 6 (six) hours as needed for moderate pain. For AFTER surgery  . oxyCODONE (OXY IR/ROXICODONE) 5 MG immediate release tablet Take 1 tablet (5 mg total) by mouth every 4 (four) hours as needed for severe pain. For AFTER surgery, do not take and drive  . senna-docusate (SENOKOT-S) 8.6-50 MG tablet Take 2 tablets by mouth at bedtime. For AFTER surgery, do not take if having loose stools   No facility-administered encounter medications on file as of 08/06/2018.     Allergy:  Allergies  Allergen Reactions  . Sulfa Antibiotics Anaphylaxis and Rash  . Penicillins     Social Hx:   Social History   Socioeconomic History  . Marital status: Married    Spouse name: Not on file  . Number of children: Not on file  . Years of education: Not on file  . Highest education level: Not on file  Occupational History  . Not on file  Social Needs  . Financial resource strain: Not on file  . Food insecurity:    Worry: Not on file     Inability: Not on file  . Transportation needs:    Medical: Not on file    Non-medical: Not on file  Tobacco Use  . Smoking status: Never Smoker  . Smokeless tobacco: Never Used  Substance and Sexual Activity  . Alcohol use: No  . Drug use: No  . Sexual activity: Yes    Birth control/protection: Surgical    Comment: spouse had vas  Lifestyle  . Physical activity:    Days per week: Not on file    Minutes per session: Not on file  . Stress: Not on file  Relationships  . Social connections:    Talks on phone: Not on file    Gets together: Not on file    Attends religious service: Not on file    Active member of club or organization: Not on file    Attends meetings of clubs or organizations: Not on file    Relationship status: Not on file  . Intimate partner violence:    Fear of current or ex partner: Not on file    Emotionally abused: Not on file    Physically abused: Not on file    Forced sexual activity: Not on file  Other Topics Concern  . Not on file  Social History Narrative  . Not on file    Past Surgical Hx:  Past Surgical History:  Procedure Laterality Date  . BREAST BIOPSY Left   . WISDOM TOOTH EXTRACTION      Past Medical Hx:  Past Medical History:  Diagnosis Date  . Hypertension     Past Gynecological History:  SVD x 2. No LMP recorded.  Family Hx:  Family History  Problem Relation Age of Onset  . Thyroid disease Mother   . Hypertension Father   . Hyperlipidemia Father   . Heart attack Paternal Uncle   . Hypertension Maternal Uncle   . Hypertension Maternal Grandmother   . Heart disease Paternal Uncle   . Hypertension Paternal Uncle   . Colon cancer Paternal Grandmother     Review of Systems:  Constitutional  Feels well,    ENT Normal appearing ears and nares bilaterally Skin/Breast  No rash, sores, jaundice, itching, dryness Cardiovascular  No chest pain, shortness of breath, or edema  Pulmonary  No cough or wheeze.  Gastro  Intestinal  No nausea, vomitting, or diarrhoea. No bright red blood per rectum, no abdominal pain, change in bowel movement, or constipation.  Genito Urinary  No frequency, urgency, dysuria, + irregular vaginal bleeding/perimenopausal Musculo Skeletal  No myalgia, arthralgia, joint swelling or pain  Neurologic  No weakness, numbness, change in gait,  Psychology  No depression, anxiety, insomnia.   Vitals:  Blood pressure (!) 147/78, pulse 86, temperature 98.2 F (36.8 C), resp. rate 18, height 5\' 2"  (1.575 m), weight 113 lb 14.4 oz (51.7 kg), SpO2 100 %.  Physical Exam: WD in NAD Neck  Supple NROM, without any enlargements.  Lymph Node Survey No cervical supraclavicular or inguinal adenopathy Cardiovascular  Pulse normal rate, regularity and rhythm. S1 and S2 normal.  Lungs  Clear to auscultation bilateraly, without wheezes/crackles/rhonchi. Good air movement.  Skin  No rash/lesions/breakdown  Psychiatry  Alert and oriented to person, place, and time  Abdomen  Normoactive bowel sounds, abdomen soft, non-tender and thin without evidence of hernia.  Back No CVA tenderness Genito Urinary  Vulva/vagina: Normal external female genitalia.   No lesions. No discharge or bleeding.  Bladder/urethra:  No lesions or masses, well supported bladder  Vagina: norma  Cervix: Normal appearing, no lesions.  Uterus:  Small, mobile, no parametrial involvement or nodularity.  Adnexa: Fullness, mobile, smooth mass in left adnexa. Rectal  Good tone, no masses no cul de sac nodularity.  Extremities  No bilateral cyanosis, clubbing or edema.   Thereasa Solo, MD  08/06/2018, 3:19 PM

## 2018-08-06 NOTE — Patient Instructions (Signed)
Preparing for your Surgery  Plan for surgery on August 15, 2018 with Dr. Everitt Amber at [facility] will be scheduled for a robotic assisted left salpingo-oophorectomy, bilateral salpingectomy, possible total hysterectomy, possible staging.   Pre-operative Testing -You will receive a phone call from presurgical testing at [facility] if you have not received a call already to arrange for a pre-operative testing appointment before your surgery.  This appointment normally occurs one to two weeks before your scheduled surgery.   -Bring your insurance card, copy of an advanced directive if applicable, medication list  -At that visit, you will be asked to sign a consent for a possible blood transfusion in case a transfusion becomes necessary during surgery.  The need for a blood transfusion is rare but having consent is a necessary part of your care.     -You should not be taking blood thinners or aspirin at least ten days prior to surgery unless instructed by your surgeon.  -As part of our enhanced surgical recovery pathway, you may be advised to drink a carbohydrate drink the morning of surgery (at least 3 hours before). If you are diabetic, this will be avoided in order to prevent elevated glucose levels.  Day Before Surgery at [place] will be asked to take in a light diet the day before surgery.  Avoid carbonated beverages.  You will be advised to have nothing to eat or drink after midnight the evening before.    Eat a light diet the day before surgery.  Examples including soups, broths, toast, yogurt, mashed potatoes.  Things to avoid include carbonated beverages (fizzy beverages), raw fruits and raw vegetables, or beans.   If your bowels are filled with gas, your surgeon will have difficulty visualizing your pelvic organs which increases your surgical risks.  Your role in recovery Your role is to become active as soon as directed by your doctor, while still giving  yourself time to heal.  Rest when you feel tired. You will be asked to do the following in order to speed your recovery:  - Cough and breathe deeply. This helps toclear and expand your lungs and can prevent pneumonia. You may be given a spirometer to practice deep breathing. A staff member will show you how to use the spirometer. - Do mild physical activity. Walking or moving your legs help your circulation and body functions return to normal. A staff member will help you when you try to walk and will provide you with simple exercises. Do not try to get up or walk alone the first time. - Actively manage your pain. Managing your pain lets you move in comfort. We will ask you to rate your pain on a scale of zero to 10. It is your responsibility to tell your doctor or nurse where and how much you hurt so your pain can be treated.  Special Considerations -If you are diabetic, you may be placed on insulin after surgery to have closer control over your blood sugars to promote healing and recovery.  This does not mean that you will be discharged on insulin.  If applicable, your oral antidiabetics will be resumed when you are tolerating a solid diet.  -Your final pathology results from surgery should be available around one week after surgery and the results will be relayed to you when available.  -Dr. Lahoma Crocker is the Surgeon that assists your GYN Oncologist with surgery.  If you stay the night, the next day after your  surgery you will either see Dr. Denman George or Dr. Lahoma Crocker.  -FMLA forms can be faxed to [phone] and please allow 5-7 business days for completion.  Pain Management After Surgery -You have been prescribed your pain medication and bowel regimen medications before surgery so that you can have these available when you are discharged from the hospital. The pain medication is for use ONLY AFTER surgery and a new prescription will not be given.   -Make sure that you have  Tylenol and Ibuprofen at home to use on a regular basis after surgery for pain control. We recommend alternating the medications every hour to six hours since they work differently and are processed in the body differently for pain relief.  -Review the attached handout on narcotic use and their risks and side effects.   Bowel Regimen -You have been prescribed Sennakot-S to take nightly to prevent constipation especially if you are taking the narcotic pain medication intermittently.  It is important to prevent constipation and drink adequate amounts of liquids.  Blood Transfusion Information WHAT IS A BLOOD TRANSFUSION? A transfusion is the replacement of blood or some of its parts. Blood is made up of multiple cells which provide different functions.  Red blood cells carry oxygen and are used for blood loss replacement.  White blood cells fight against infection.  Platelets control bleeding.  Plasma helps clot blood.  Other blood products are available for specialized needs, such as hemophilia or other clotting disorders. BEFORE THE TRANSFUSION  Who gives blood for transfusions?   You may be able to donate blood to be used at a later date on yourself (autologous donation).  Relatives can be asked to donate blood. This is generally not any safer than if you have received blood from a stranger. The same precautions are taken to ensure safety when a relative's blood is donated.  Healthy volunteers who are fully evaluated to make sure their blood is safe. This is blood bank blood. Transfusion therapy is the safest it has ever been in the practice of medicine. Before blood is taken from a donor, a complete history is taken to make sure that person has no history of diseases nor engages in risky social behavior (examples are intravenous drug use or sexual activity with multiple partners). The donor's travel history is screened to minimize risk of transmitting infections, such as malaria. The  donated blood is tested for signs of infectious diseases, such as HIV and hepatitis. The blood is then tested to be sure it is compatible with you in order to minimize the chance of a transfusion reaction. If you or a relative donates blood, this is often done in anticipation of surgery and is not appropriate for emergency situations. It takes many days to process the donated blood. RISKS AND COMPLICATIONS Although transfusion therapy is very safe and saves many lives, the main dangers of transfusion include:   Getting an infectious disease.  Developing a transfusion reaction. This is an allergic reaction to something in the blood you were given. Every precaution is taken to prevent this. The decision to have a blood transfusion has been considered carefully by your caregiver before blood is given. Blood is not given unless the benefits outweigh the risks.

## 2018-08-07 NOTE — Patient Instructions (Addendum)
Leslie Pittman  08/07/2018       Your procedure is scheduled on:  08-15-2018   Report to [facility] Main  Entrance,  Report to admitting at  1:00 PM    Call this number if you have problems the morning of surgery [phone]       Remember: Eat a light diet the day before surgery.  Examples including soups, broths, toast, yogurt, mashed potatoes.  Things to avoid include carbonated beverages (fizzy beverages), raw fruits and raw vegetables, or beans.   If your bowels are filled with gas, your surgeon will have difficulty visualizing your pelvic organs which increases your surgical risks.  NO SOLID FOOD AFTER MIDNIGHT THE NIGHT PRIOR TO SURGERY. NOTHING BY MOUTH EXCEPT CLEAR LIQUIDS UNTIL 3 HOURS PRIOR TO SCHEULED SURGERY 12:00 PM.  PLEASE FINISH ENSURE DRINK PER SURGEON ORDER 3 HOURS PRIOR TO SCHEDULED SURGERY TIME WHICH NEEDS TO BE COMPLETED AT 12:00 PM.  NOTHING BY MOUTH INCLUDING WATER,CANDY, GUM, Geiger.   BRUSH YOUR TEETH MORNING OF SURGERY AND RINSE YOUR MOUTH OUT         Take these medicines the morning of surgery with A SIP OF WATER:   Amlodipine (norvasc)                                   You may not have any metal on your body including hair pins and piercings.              Do not wear jewelry, make-up, lotions, powders or perfumes, deodorant              Do not wear nail polish.  Do not shave  48 hours prior to surgery.                  Do not bring valuables to the hospital. [place].  Contacts, dentures or bridgework may not be worn into surgery.  Leave suitcase in the car. After surgery it may be brought to your room.       Patients discharged the day of surgery will not be allowed to drive home. IF YOU ARE HAVING SURGERY AND GOING HOME THE SAME DAY, YOU MUST HAVE AN ADULT TO DRIVE YOU HOME AND BE WITH YOU FOR 24 HOURS. YOU MAY GO HOME BY TAXI OR UBER OR ORTHERWISE, BUT AN  ADULT MUST ACCOMPANY YOU HOME AND STAY WITH YOU FOR 24 HOURS.     Name and phone number of your driver:  Mother-- Gaylyn Lambert  # [phone]               _____________________________________________________________________     CLEAR LIQUID DIET   Foods Allowed                                                                     Foods Excluded  Coffee and tea, regular and decaf  liquids that you cannot  Plain Jell-O in any flavor                                             see through such as: No Fruit ices (not with fruit pulp)                                     Milk/ cream products, soups, orange juice  Iced popcicles                                 Cranberry, grape and apple juices Sports drinks like Gatorade Lightly seasoned clear broth or consume(fat free) Sugar, honey syrup    _____________________________________________________________________             [facility] - Preparing for Surgery Before surgery, you can play an important role.  Because skin is not sterile, your skin needs to be as free of germs as possible.  You can reduce the number of germs on your skin by washing with CHG (chlorahexidine gluconate) soap before surgery.  CHG is an antiseptic cleaner which kills germs and bonds with the skin to continue killing germs even after washing. Please DO NOT use if you have an allergy to CHG or antibacterial soaps.  If your skin becomes reddened/irritated stop using the CHG and inform your nurse when you arrive at Short Stay. Do not shave (including legs and underarms) for at least 48 hours prior to the first CHG shower.  You may shave your face/neck. Please follow these instructions carefully:  1.  Shower with CHG Soap the night before surgery and the  morning of Surgery.  2.  If you choose to wash your hair, wash your hair first as usual with your  normal  shampoo.  3.  After you shampoo, rinse your hair and body thoroughly to  remove the  shampoo.                            4.  Use CHG as you would any other liquid soap.  You can apply chg directly  to the skin and wash                       Gently with a scrungie or clean washcloth.  5.  Apply the CHG Soap to your body ONLY FROM THE NECK DOWN.   Do not use on face/ open                           Wound or open sores. Avoid contact with eyes, ears mouth and genitals (private parts).                       Wash face,  Genitals (private parts) with your normal soap.             6.  Wash thoroughly, paying special attention to the area where your surgery  will be performed.  7.  Thoroughly rinse your body with warm water from the neck down.  8.  DO NOT shower/wash with your normal soap after using and rinsing  off  the CHG Soap.            9.  Pat yourself dry with a clean towel.            10.  Wear clean pajamas.            11.  Place clean sheets on your bed the night of your first shower and do not  sleep with pets. Day of Surgery : Do not apply any lotions/deodorants the morning of surgery.  Please wear clean clothes to the hospital/surgery center.  FAILURE TO FOLLOW THESE INSTRUCTIONS MAY RESULT IN THE CANCELLATION OF YOUR SURGERY PATIENT SIGNATURE_________________________________  NURSE SIGNATURE__________________________________  ________________________________________________________________________   Adam Phenix  An incentive spirometer is a tool that can help keep your lungs clear and active. This tool measures how well you are filling your lungs with each breath. Taking long deep breaths may help reverse or decrease the chance of developing breathing (pulmonary) problems (especially infection) following:  A long period of time when you are unable to move or be active. BEFORE THE PROCEDURE   If the spirometer includes an indicator to show your best effort, your nurse or respiratory therapist will set it to a desired goal.  If possible, sit up  straight or lean slightly forward. Try not to slouch.  Hold the incentive spirometer in an upright position. INSTRUCTIONS FOR USE  1. Sit on the edge of your bed if possible, or sit up as far as you can in bed or on a chair. 2. Hold the incentive spirometer in an upright position. 3. Breathe out normally. 4. Place the mouthpiece in your mouth and seal your lips tightly around it. 5. Breathe in slowly and as deeply as possible, raising the piston or the ball toward the top of the column. 6. Hold your breath for 3-5 seconds or for as long as possible. Allow the piston or ball to fall to the bottom of the column. 7. Remove the mouthpiece from your mouth and breathe out normally. 8. Rest for a few seconds and repeat Steps 1 through 7 at least 10 times every 1-2 hours when you are awake. Take your time and take a few normal breaths between deep breaths. 9. The spirometer may include an indicator to show your best effort. Use the indicator as a goal to work toward during each repetition. 10. After each set of 10 deep breaths, practice coughing to be sure your lungs are clear. If you have an incision (the cut made at the time of surgery), support your incision when coughing by placing a pillow or rolled up towels firmly against it. Once you are able to get out of bed, walk around indoors and cough well. You may stop using the incentive spirometer when instructed by your caregiver.  RISKS AND COMPLICATIONS  Take your time so you do not get dizzy or light-headed.  If you are in pain, you may need to take or ask for pain medication before doing incentive spirometry. It is harder to take a deep breath if you are having pain. AFTER USE  Rest and breathe slowly and easily.  It can be helpful to keep track of a log of your progress. Your caregiver can provide you with a simple table to help with this. If you are using the spirometer at home, follow these instructions: [place] IF:   You are  having difficultly using the spirometer.  You have trouble using the spirometer as often as  instructed.  Your pain medication is not giving enough relief while using the spirometer.  You develop fever of 100.5 F (38.1 C) or higher. SEEK IMMEDIATE MEDICAL CARE IF:   You cough up bloody sputum that had not been present before.  You develop fever of 102 F (38.9 C) or greater.  You develop worsening pain at or near the incision site. MAKE SURE YOU:   Understand these instructions.  Will watch your condition.  Will get help right away if you are not doing well or get worse. Document Released: 10/02/2006 Document Revised: 08/14/2011 Document Reviewed: 12/03/2006 Exit[employer] Patient Information 2014 [employer], [place].   ________________________________________________________________________  WHAT IS A BLOOD TRANSFUSION? Blood Transfusion Information  A transfusion is the replacement of blood or some of its parts. Blood is made up of multiple cells which provide different functions.  Red blood cells carry oxygen and are used for blood loss replacement.  White blood cells fight against infection.  Platelets control bleeding.  Plasma helps clot blood.  Other blood products are available for specialized needs, such as hemophilia or other clotting disorders. BEFORE THE TRANSFUSION  Who gives blood for transfusions?   Healthy volunteers who are fully evaluated to make sure their blood is safe. This is blood bank blood. Transfusion therapy is the safest it has ever been in the practice of medicine. Before blood is taken from a donor, a complete history is taken to make sure that person has no history of diseases nor engages in risky social behavior (examples are intravenous drug use or sexual activity with multiple partners). The donor's travel history is screened to minimize risk of transmitting infections, such as malaria. The donated blood is tested for signs of infectious diseases,  such as HIV and hepatitis. The blood is then tested to be sure it is compatible with you in order to minimize the chance of a transfusion reaction. If you or a relative donates blood, this is often done in anticipation of surgery and is not appropriate for emergency situations. It takes many days to process the donated blood. RISKS AND COMPLICATIONS Although transfusion therapy is very safe and saves many lives, the main dangers of transfusion include:   Getting an infectious disease.  Developing a transfusion reaction. This is an allergic reaction to something in the blood you were given. Every precaution is taken to prevent this. The decision to have a blood transfusion has been considered carefully by your caregiver before blood is given. Blood is not given unless the benefits outweigh the risks. AFTER THE TRANSFUSION  Right after receiving a blood transfusion, you will usually feel much better and more energetic. This is especially true if your red blood cells have gotten low (anemic). The transfusion raises the level of the red blood cells which carry oxygen, and this usually causes an energy increase.  The nurse administering the transfusion will monitor you carefully for complications. HOME CARE INSTRUCTIONS  No special instructions are needed after a transfusion. You may find your energy is better. Speak with your caregiver about any limitations on activity for underlying diseases you may have. SEEK MEDICAL CARE IF:   Your condition is not improving after your transfusion.  You develop redness or irritation at the intravenous (IV) site. SEEK IMMEDIATE MEDICAL CARE IF:  Any of the following symptoms occur over the next 12 hours:  Shaking chills.  You have a temperature by mouth above 102 F (38.9 C), not controlled by medicine.  Chest, back, or muscle pain.  People around you feel you are not acting correctly or are confused.  Shortness of breath or difficulty  breathing.  Dizziness and fainting.  You get a rash or develop hives.  You have a decrease in urine output.  Your urine turns a dark color or changes to pink, red, or brown. Any of the following symptoms occur over the next 10 days:  You have a temperature by mouth above 102 F (38.9 C), not controlled by medicine.  Shortness of breath.  Weakness after normal activity.  The white part of the eye turns yellow (jaundice).  You have a decrease in the amount of urine or are urinating less often.  Your urine turns a dark color or changes to pink, red, or brown. Document Released: 05/19/2000 Document Revised: 08/14/2011 Document Reviewed: 01/06/2008 [facility] Patient Information 2014 [place], [place].  _______________________________________________________________________

## 2018-08-08 ENCOUNTER — Encounter (HOSPITAL_COMMUNITY): Payer: Self-pay

## 2018-08-08 ENCOUNTER — Encounter (HOSPITAL_COMMUNITY)
Admission: RE | Admit: 2018-08-08 | Discharge: 2018-08-08 | Disposition: A | Discharge status: Home / Self Care | Payer: BC Managed Care – PPO | Source: Ambulatory Visit | Attending: Gynecologic Oncology | Admitting: Gynecologic Oncology

## 2018-08-08 ENCOUNTER — Other Ambulatory Visit: Payer: Self-pay

## 2018-08-08 ENCOUNTER — Encounter: Payer: Self-pay | Admitting: Gynecologic Oncology

## 2018-08-08 DIAGNOSIS — I4510 Unspecified right bundle-branch block: Secondary | ICD-10-CM | POA: Insufficient documentation

## 2018-08-08 DIAGNOSIS — I498 Other specified cardiac arrhythmias: Secondary | ICD-10-CM | POA: Diagnosis not present

## 2018-08-08 DIAGNOSIS — Z01818 Encounter for other preprocedural examination: Secondary | ICD-10-CM | POA: Insufficient documentation

## 2018-08-08 DIAGNOSIS — N839 Noninflammatory disorder of ovary, fallopian tube and broad ligament, unspecified: Secondary | ICD-10-CM | POA: Diagnosis not present

## 2018-08-08 DIAGNOSIS — R9431 Abnormal electrocardiogram [ECG] [EKG]: Secondary | ICD-10-CM | POA: Insufficient documentation

## 2018-08-08 HISTORY — DX: Other seasonal allergic rhinitis: J30.2

## 2018-08-08 HISTORY — DX: Other constipation: K59.09

## 2018-08-08 HISTORY — DX: Personal history of other specified conditions: Z87.898

## 2018-08-08 HISTORY — DX: Other noninflammatory disorders of ovary, fallopian tube and broad ligament: N83.8

## 2018-08-08 HISTORY — DX: Presence of spectacles and contact lenses: Z97.3

## 2018-08-08 LAB — COMPREHENSIVE METABOLIC PANEL
ALT: 13 U/L (ref 0–44)
AST: 18 U/L (ref 15–41)
Albumin: 4.2 g/dL (ref 3.5–5.0)
Alkaline Phosphatase: 59 U/L (ref 38–126)
Anion gap: 8 (ref 5–15)
BILIRUBIN TOTAL: 0.5 mg/dL (ref 0.3–1.2)
BUN: 14 mg/dL (ref 6–20)
CO2: 27 mmol/L (ref 22–32)
Calcium: 9.1 mg/dL (ref 8.9–10.3)
Chloride: 104 mmol/L (ref 98–111)
Creatinine, Ser: 0.69 mg/dL (ref 0.44–1.00)
GFR calc Af Amer: >60 mL/min (ref >60)
GFR calc non Af Amer: >60 mL/min (ref >60)
Glucose, Bld: 89 mg/dL (ref 70–99)
Potassium: 3.2 mmol/L — ABNORMAL LOW (ref 3.5–5.1)
Sodium: 139 mmol/L (ref 135–145)
Total Protein: 7.7 g/dL (ref 6.5–8.1)

## 2018-08-08 LAB — URINALYSIS, ROUTINE W REFLEX MICROSCOPIC
Bacteria, UA: NONE SEEN
Bilirubin Urine: NEGATIVE
Glucose, UA: NEGATIVE mg/dL
KETONES UR: NEGATIVE mg/dL
Leukocytes,Ua: NEGATIVE
Nitrite: NEGATIVE
PROTEIN: NEGATIVE mg/dL
Specific Gravity, Urine: 1.019 (ref 1.005–1.030)
pH: 7 (ref 5.0–8.0)

## 2018-08-08 LAB — ABO/RH: ABO/RH(D): O POS

## 2018-08-08 LAB — CBC
HCT: 40.6 % (ref 36.0–46.0)
Hemoglobin: 12.6 g/dL (ref 12.0–15.0)
MCH: 29.4 pg (ref 26.0–34.0)
MCHC: 31 g/dL (ref 30.0–36.0)
MCV: 94.6 fL (ref 80.0–100.0)
NRBC: 0 % (ref 0.0–0.2)
Platelets: 315 10*3/uL (ref 150–400)
RBC: 4.29 MIL/uL (ref 3.87–5.11)
RDW: 12.1 % (ref 11.5–15.5)
WBC: 8.4 10*3/uL (ref 4.0–10.5)

## 2018-08-09 ENCOUNTER — Ambulatory Visit: Payer: BC Managed Care – PPO | Admitting: Gynecologic Oncology

## 2018-08-09 ENCOUNTER — Telehealth: Payer: Self-pay

## 2018-08-09 NOTE — Telephone Encounter (Signed)
Told Leslie Pittman that her potassium was a little low on her pre op labs at 3.2. This is an important electrolyte especially for heart function.  Leslie John, NP said to increase potassium rich food in her diet such as bananas, apricots, OJ, Cantaloupe. Pt verbalized understanding.

## 2018-08-15 ENCOUNTER — Encounter (HOSPITAL_COMMUNITY)
Admission: RE | Disposition: A | Discharge status: Home / Self Care | Payer: Self-pay | Source: Home / Self Care | Attending: Gynecologic Oncology

## 2018-08-15 ENCOUNTER — Other Ambulatory Visit: Payer: Self-pay

## 2018-08-15 ENCOUNTER — Ambulatory Visit (HOSPITAL_COMMUNITY): Payer: BC Managed Care – PPO | Admitting: Anesthesiology

## 2018-08-15 ENCOUNTER — Ambulatory Visit (HOSPITAL_COMMUNITY)
Admission: RE | Admit: 2018-08-15 | Discharge: 2018-08-15 | Disposition: A | Discharge status: Home / Self Care | Payer: BC Managed Care – PPO | Attending: Gynecologic Oncology | Admitting: Gynecologic Oncology

## 2018-08-15 ENCOUNTER — Encounter (HOSPITAL_COMMUNITY): Payer: Self-pay | Admitting: Emergency Medicine

## 2018-08-15 ENCOUNTER — Ambulatory Visit (HOSPITAL_COMMUNITY): Payer: BC Managed Care – PPO | Admitting: Physician Assistant

## 2018-08-15 DIAGNOSIS — N83202 Unspecified ovarian cyst, left side: Secondary | ICD-10-CM

## 2018-08-15 DIAGNOSIS — N8302 Follicular cyst of left ovary: Secondary | ICD-10-CM | POA: Insufficient documentation

## 2018-08-15 DIAGNOSIS — Z79899 Other long term (current) drug therapy: Secondary | ICD-10-CM | POA: Diagnosis not present

## 2018-08-15 DIAGNOSIS — N838 Other noninflammatory disorders of ovary, fallopian tube and broad ligament: Secondary | ICD-10-CM | POA: Insufficient documentation

## 2018-08-15 DIAGNOSIS — Z302 Encounter for sterilization: Secondary | ICD-10-CM | POA: Diagnosis not present

## 2018-08-15 DIAGNOSIS — I10 Essential (primary) hypertension: Secondary | ICD-10-CM | POA: Diagnosis not present

## 2018-08-15 HISTORY — PX: ROBOTIC ASSISTED SALPINGO OOPHERECTOMY: SHX6082

## 2018-08-15 LAB — TYPE AND SCREEN
ABO/RH(D): O POS
Antibody Screen: NEGATIVE

## 2018-08-15 LAB — PREGNANCY, URINE: Preg Test, Ur: NEGATIVE

## 2018-08-15 SURGERY — SALPINGO-OOPHORECTOMY, ROBOT-ASSISTED
Anesthesia: General | Laterality: Bilateral

## 2018-08-15 MED ORDER — DEXAMETHASONE SODIUM PHOSPHATE 10 MG/ML IJ SOLN
INTRAMUSCULAR | Status: DC | PRN
  Administered 2018-08-15: 10 mg via INTRAVENOUS

## 2018-08-15 MED ORDER — CLINDAMYCIN PHOSPHATE 900 MG/50ML IV SOLN
900.0000 mg | INTRAVENOUS | Status: AC
  Administered 2018-08-15: 900 mg via INTRAVENOUS
  Filled 2018-08-15: qty 50

## 2018-08-15 MED ORDER — FENTANYL CITRATE (PF) 250 MCG/5ML IJ SOLN
INTRAMUSCULAR | Status: AC
  Filled 2018-08-15: qty 5

## 2018-08-15 MED ORDER — LACTATED RINGERS IR SOLN
Status: DC | PRN
  Administered 2018-08-15: 1000 mL

## 2018-08-15 MED ORDER — SUGAMMADEX SODIUM 200 MG/2ML IV SOLN
INTRAVENOUS | Status: DC | PRN
  Administered 2018-08-15: 100 mg via INTRAVENOUS

## 2018-08-15 MED ORDER — ACETAMINOPHEN 160 MG/5ML PO SOLN: 1000.0000 mg | Freq: Once | ORAL | Status: DC | PRN

## 2018-08-15 MED ORDER — SODIUM CHLORIDE 0.9 % IV SOLN: 250.0000 mL | INTRAVENOUS | Status: DC | PRN

## 2018-08-15 MED ORDER — MORPHINE SULFATE (PF) 4 MG/ML IV SOLN: 2.0000 mg | INTRAVENOUS | Status: DC | PRN

## 2018-08-15 MED ORDER — ACETAMINOPHEN 500 MG PO TABS
1000.0000 mg | ORAL_TABLET | ORAL | Status: AC
  Administered 2018-08-15: 1000 mg via ORAL
  Filled 2018-08-15: qty 2

## 2018-08-15 MED ORDER — LIDOCAINE 2% (20 MG/ML) 5 ML SYRINGE
INTRAMUSCULAR | Status: DC | PRN
  Administered 2018-08-15: 60 mg via INTRAVENOUS
  Administered 2018-08-15: 40 mg via INTRAVENOUS

## 2018-08-15 MED ORDER — ROCURONIUM BROMIDE 10 MG/ML (PF) SYRINGE
PREFILLED_SYRINGE | INTRAVENOUS | Status: AC
  Filled 2018-08-15: qty 10

## 2018-08-15 MED ORDER — DEXAMETHASONE SODIUM PHOSPHATE 4 MG/ML IJ SOLN: 4.0000 mg | INTRAMUSCULAR | Status: DC

## 2018-08-15 MED ORDER — ONDANSETRON HCL 4 MG/2ML IJ SOLN
INTRAMUSCULAR | Status: DC | PRN
  Administered 2018-08-15: 4 mg via INTRAVENOUS

## 2018-08-15 MED ORDER — ACETAMINOPHEN 10 MG/ML IV SOLN: 1000.0000 mg | Freq: Once | INTRAVENOUS | Status: DC | PRN

## 2018-08-15 MED ORDER — BUPIVACAINE HCL (PF) 0.25 % IJ SOLN
INTRAMUSCULAR | Status: DC | PRN
  Administered 2018-08-15: 15 mL

## 2018-08-15 MED ORDER — MIDAZOLAM HCL 2 MG/2ML IJ SOLN
INTRAMUSCULAR | Status: DC | PRN
  Administered 2018-08-15: 2 mg via INTRAVENOUS

## 2018-08-15 MED ORDER — ACETAMINOPHEN 650 MG RE SUPP
650.0000 mg | RECTAL | Status: DC | PRN
  Filled 2018-08-15: qty 1

## 2018-08-15 MED ORDER — MIDAZOLAM HCL 2 MG/2ML IJ SOLN
INTRAMUSCULAR | Status: AC
  Filled 2018-08-15: qty 2

## 2018-08-15 MED ORDER — FENTANYL CITRATE (PF) 250 MCG/5ML IJ SOLN
INTRAMUSCULAR | Status: DC | PRN
  Administered 2018-08-15: 100 ug via INTRAVENOUS
  Administered 2018-08-15 (×3): 50 ug via INTRAVENOUS

## 2018-08-15 MED ORDER — OXYCODONE HCL 5 MG/5ML PO SOLN: 5.0000 mg | Freq: Once | ORAL | Status: DC | PRN

## 2018-08-15 MED ORDER — ACETAMINOPHEN 325 MG PO TABS: 650.0000 mg | ORAL_TABLET | Freq: Four times a day (QID) | ORAL | 2 refills | Status: DC | PRN

## 2018-08-15 MED ORDER — SODIUM CHLORIDE 0.9% FLUSH: 3.0000 mL | INTRAVENOUS | Status: DC | PRN

## 2018-08-15 MED ORDER — ROCURONIUM BROMIDE 10 MG/ML (PF) SYRINGE
PREFILLED_SYRINGE | INTRAVENOUS | Status: DC | PRN
  Administered 2018-08-15: 50 mg via INTRAVENOUS

## 2018-08-15 MED ORDER — SUGAMMADEX SODIUM 200 MG/2ML IV SOLN
INTRAVENOUS | Status: AC
  Filled 2018-08-15: qty 2

## 2018-08-15 MED ORDER — KETOROLAC TROMETHAMINE 15 MG/ML IJ SOLN: 15.0000 mg | Freq: Four times a day (QID) | INTRAMUSCULAR | Status: DC

## 2018-08-15 MED ORDER — CIPROFLOXACIN IN D5W 400 MG/200ML IV SOLN
400.0000 mg | INTRAVENOUS | Status: AC
  Administered 2018-08-15: 400 mg via INTRAVENOUS
  Filled 2018-08-15: qty 200

## 2018-08-15 MED ORDER — OXYCODONE HCL 5 MG PO TABS: 5.0000 mg | ORAL_TABLET | ORAL | Status: DC | PRN

## 2018-08-15 MED ORDER — PROPOFOL 10 MG/ML IV BOLUS
INTRAVENOUS | Status: DC | PRN
  Administered 2018-08-15 (×2): 40 mg via INTRAVENOUS
  Administered 2018-08-15: 110 mg via INTRAVENOUS

## 2018-08-15 MED ORDER — SCOPOLAMINE 1 MG/3DAYS TD PT72
1.0000 | MEDICATED_PATCH | TRANSDERMAL | Status: DC
  Administered 2018-08-15: 1.5 mg via TRANSDERMAL
  Filled 2018-08-15: qty 1

## 2018-08-15 MED ORDER — ONDANSETRON HCL 4 MG/2ML IJ SOLN
INTRAMUSCULAR | Status: AC
  Filled 2018-08-15: qty 2

## 2018-08-15 MED ORDER — LACTATED RINGERS IV SOLN
INTRAVENOUS | Status: DC
  Administered 2018-08-15 (×2): via INTRAVENOUS

## 2018-08-15 MED ORDER — LIDOCAINE 2% (20 MG/ML) 5 ML SYRINGE
INTRAMUSCULAR | Status: AC
  Filled 2018-08-15: qty 5

## 2018-08-15 MED ORDER — FENTANYL CITRATE (PF) 100 MCG/2ML IJ SOLN: 25.0000 ug | INTRAMUSCULAR | Status: DC | PRN

## 2018-08-15 MED ORDER — OXYCODONE HCL 5 MG PO TABS: 5.0000 mg | ORAL_TABLET | Freq: Once | ORAL | Status: DC | PRN

## 2018-08-15 MED ORDER — GABAPENTIN 300 MG PO CAPS
300.0000 mg | ORAL_CAPSULE | ORAL | Status: AC
  Administered 2018-08-15: 300 mg via ORAL
  Filled 2018-08-15: qty 1

## 2018-08-15 MED ORDER — ACETAMINOPHEN 325 MG PO TABS: 650.0000 mg | ORAL_TABLET | ORAL | Status: DC | PRN

## 2018-08-15 MED ORDER — BUPIVACAINE HCL (PF) 0.25 % IJ SOLN
INTRAMUSCULAR | Status: AC
  Filled 2018-08-15: qty 30

## 2018-08-15 MED ORDER — SODIUM CHLORIDE 0.9% FLUSH: 3.0000 mL | Freq: Two times a day (BID) | INTRAVENOUS | Status: DC

## 2018-08-15 MED ORDER — DEXAMETHASONE SODIUM PHOSPHATE 10 MG/ML IJ SOLN
INTRAMUSCULAR | Status: AC
  Filled 2018-08-15: qty 1

## 2018-08-15 MED ORDER — ACETAMINOPHEN 500 MG PO TABS: 1000.0000 mg | ORAL_TABLET | Freq: Once | ORAL | Status: DC | PRN

## 2018-08-15 MED ORDER — PROPOFOL 10 MG/ML IV BOLUS
INTRAVENOUS | Status: AC
  Filled 2018-08-15: qty 20

## 2018-08-15 SURGICAL SUPPLY — 55 items
ADH SKN CLS APL DERMABOND .7 (GAUZE/BANDAGES/DRESSINGS) ×1
AGENT HMST KT MTR STRL THRMB (HEMOSTASIS)
APL ESCP 34 STRL LF DISP (HEMOSTASIS)
APPLICATOR SURGIFLO ENDO (HEMOSTASIS) IMPLANT
BAG LAPAROSCOPIC 12 15 PORT 16 (BASKET) IMPLANT
BAG RETRIEVAL 12/15 (BASKET)
BAG SPEC RTRVL LRG 6X4 10 (ENDOMECHANICALS)
COVER BACK TABLE 60X90IN (DRAPES) ×2 IMPLANT
COVER TIP SHEARS 8 DVNC (MISCELLANEOUS) ×1 IMPLANT
COVER TIP SHEARS 8MM DA VINCI (MISCELLANEOUS) ×1
COVER WAND RF STERILE (DRAPES) IMPLANT
DECANTER SPIKE VIAL GLASS SM (MISCELLANEOUS) ×1 IMPLANT
DERMABOND ADVANCED (GAUZE/BANDAGES/DRESSINGS) ×1
DERMABOND ADVANCED .7 DNX12 (GAUZE/BANDAGES/DRESSINGS) ×1 IMPLANT
DRAPE ARM DVNC X/XI (DISPOSABLE) ×4 IMPLANT
DRAPE COLUMN DVNC XI (DISPOSABLE) ×1 IMPLANT
DRAPE DA VINCI XI ARM (DISPOSABLE) ×4
DRAPE DA VINCI XI COLUMN (DISPOSABLE) ×1
DRAPE SHEET LG 3/4 BI-LAMINATE (DRAPES) ×2 IMPLANT
DRAPE SURG IRRIG POUCH 19X23 (DRAPES) ×2 IMPLANT
ELECT REM PT RETURN 15FT ADLT (MISCELLANEOUS) ×2 IMPLANT
GLOVE BIO SURGEON STRL SZ 6 (GLOVE) ×8 IMPLANT
GLOVE BIO SURGEON STRL SZ 6.5 (GLOVE) ×2 IMPLANT
GOWN STRL REUS W/ TWL LRG LVL3 (GOWN DISPOSABLE) ×2 IMPLANT
GOWN STRL REUS W/TWL LRG LVL3 (GOWN DISPOSABLE) ×4
HOLDER FOLEY CATH W/STRAP (MISCELLANEOUS) ×1 IMPLANT
IRRIG SUCT STRYKERFLOW 2 WTIP (MISCELLANEOUS) ×2
IRRIGATION SUCT STRKRFLW 2 WTP (MISCELLANEOUS) ×1 IMPLANT
KIT PROCEDURE DA VINCI SI (MISCELLANEOUS)
KIT PROCEDURE DVNC SI (MISCELLANEOUS) IMPLANT
KIT TURNOVER KIT A (KITS) IMPLANT
MANIPULATOR UTERINE 4.5 ZUMI (MISCELLANEOUS) ×2 IMPLANT
NDL SPNL 18GX3.5 QUINCKE PK (NEEDLE) IMPLANT
NEEDLE HYPO 22GX1.5 SAFETY (NEEDLE) ×1 IMPLANT
NEEDLE SPNL 18GX3.5 QUINCKE PK (NEEDLE) IMPLANT
OBTURATOR OPTICAL STANDARD 8MM (TROCAR) ×1
OBTURATOR OPTICAL STND 8 DVNC (TROCAR) ×1
OBTURATOR OPTICALSTD 8 DVNC (TROCAR) ×1 IMPLANT
PACK ROBOT GYN CUSTOM WL (TRAY / TRAY PROCEDURE) ×2 IMPLANT
PAD POSITIONING PINK XL (MISCELLANEOUS) ×2 IMPLANT
PORT ACCESS TROCAR AIRSEAL 12 (TROCAR) ×1 IMPLANT
PORT ACCESS TROCAR AIRSEAL 5M (TROCAR) ×1
POUCH SPECIMEN RETRIEVAL 10MM (ENDOMECHANICALS) IMPLANT
SEAL CANN UNIV 5-8 DVNC XI (MISCELLANEOUS) ×4 IMPLANT
SEAL XI 5MM-8MM UNIVERSAL (MISCELLANEOUS) ×4
SET TRI-LUMEN FLTR TB AIRSEAL (TUBING) ×2 IMPLANT
SURGIFLO W/THROMBIN 8M KIT (HEMOSTASIS) IMPLANT
SUT VIC AB 0 CT1 27 (SUTURE)
SUT VIC AB 0 CT1 27XBRD ANTBC (SUTURE) IMPLANT
SYR 10ML LL (SYRINGE) IMPLANT
TOWEL OR NON WOVEN STRL DISP B (DISPOSABLE) ×2 IMPLANT
TRAP SPECIMEN MUCOUS 40CC (MISCELLANEOUS) IMPLANT
TRAY FOLEY MTR SLVR 16FR STAT (SET/KITS/TRAYS/PACK) ×2 IMPLANT
UNDERPAD 30X30 (UNDERPADS AND DIAPERS) ×2 IMPLANT
WATER STERILE IRR 1000ML POUR (IV SOLUTION) ×2 IMPLANT

## 2018-08-15 NOTE — Transfer of Care (Signed)
Immediate Anesthesia Transfer of Care Note  Patient: Leslie Pittman  Procedure(s) Performed: XI ROBOTIC ASSISTED LEFT SALPINGO-OOPHORECTOMY, RIGHT SALPINGECTOMY (Bilateral )  Patient Location: PACU  Anesthesia Type:General  Level of Consciousness: awake, alert , oriented and patient cooperative  Airway & Oxygen Therapy: Patient Spontanous Breathing and Patient connected to face mask oxygen  Post-op Assessment: Report given to RN and Post -op Vital signs reviewed and stable  Post vital signs: Reviewed and stable  Last Vitals:  Vitals Value Taken Time  BP 122/77 08/15/2018  4:22 PM  Temp    Pulse 96 08/15/2018  4:27 PM  Resp 13 08/15/2018  4:27 PM  SpO2 100 % 08/15/2018  4:27 PM  Vitals shown include unvalidated device data.  Last Pain:  Vitals:   08/15/18 1323  TempSrc:   PainSc: 0-No pain      Patients Stated Pain Goal: 4 (82/42/35 [id number])  Complications: No apparent anesthesia complications

## 2018-08-15 NOTE — Anesthesia Procedure Notes (Signed)
Procedure Name: Intubation Date/Time: 08/15/2018 2:58 PM Performed by: Sharlette Dense, CRNA Patient Re-evaluated:Patient Re-evaluated prior to induction Oxygen Delivery Method: Circle system utilized Preoxygenation: Pre-oxygenation with 100% oxygen Induction Type: IV induction Ventilation: Mask ventilation without difficulty and Oral airway inserted - appropriate to patient size Laryngoscope Size: Sabra Heck and 2 Grade View: Grade II Tube type: Oral Tube size: 7.0 mm Number of attempts: 2 Airway Equipment and Method: Stylet Placement Confirmation: ETT inserted through vocal cords under direct vision,  positive ETCO2 and breath sounds checked- equal and bilateral Secured at: 20 cm Tube secured with: Tape Dental Injury: Injury to lip  Comments: Small mouth, anterior airway

## 2018-08-15 NOTE — Anesthesia Preprocedure Evaluation (Addendum)
Anesthesia Evaluation  Patient identified by MRN, date of birth, ID band Patient awake    Reviewed: Allergy & Precautions, NPO status , Patient's Chart, lab work & pertinent test results  History of Anesthesia Complications Negative for: history of anesthetic complications  Airway Mallampati: I  TM Distance: >3 FB Neck ROM: Full    Dental  (+) Teeth Intact   Pulmonary neg pulmonary ROS,    breath sounds clear to auscultation       Cardiovascular hypertension, Pt. on medications  Rhythm:Regular     Neuro/Psych negative neurological ROS  negative psych ROS   GI/Hepatic negative GI ROS, Neg liver ROS,   Endo/Other  negative endocrine ROS  Renal/GU negative Renal ROS     Musculoskeletal negative musculoskeletal ROS (+)   Abdominal   Peds  Hematology negative hematology ROS (+)   Anesthesia Other Findings   Reproductive/Obstetrics                            Anesthesia Physical Anesthesia Plan  ASA: II  Anesthesia Plan: General   Post-op Pain Management:    Induction: Intravenous  PONV Risk Score and Plan: 3 and Ondansetron and Dexamethasone  Airway Management Planned: Oral ETT  Additional Equipment: None  Intra-op Plan:   Post-operative Plan: Extubation in OR  Informed Consent: I have reviewed the patients History and Physical, chart, labs and discussed the procedure including the risks, benefits and alternatives for the proposed anesthesia with the patient or authorized representative who has indicated his/her understanding and acceptance.     Dental advisory given  Plan Discussed with: Surgeon and CRNA  Anesthesia Plan Comments:         Anesthesia Quick Evaluation

## 2018-08-15 NOTE — Anesthesia Postprocedure Evaluation (Signed)
Anesthesia Post Note  Patient: IVON ROEDEL  Procedure(s) Performed: XI ROBOTIC ASSISTED LEFT SALPINGO-OOPHORECTOMY, RIGHT SALPINGECTOMY (Bilateral )     Patient location during evaluation: PACU Anesthesia Type: General Level of consciousness: awake and alert Pain management: pain level controlled Vital Signs Assessment: post-procedure vital signs reviewed and stable Respiratory status: spontaneous breathing, nonlabored ventilation, respiratory function stable and patient connected to nasal cannula oxygen Cardiovascular status: blood pressure returned to baseline and stable Postop Assessment: no apparent nausea or vomiting Anesthetic complications: no    Last Vitals:  Vitals:   08/15/18 1715 08/15/18 1750  BP: 110/68 111/68  Pulse: 86 75  Resp: 16 16  Temp: (!) 36.4 C 36.5 C  SpO2: 100% 100%    Last Pain:  Vitals:   08/15/18 1750  TempSrc:   PainSc: 0-No pain                 Moiz Ryant

## 2018-08-15 NOTE — Op Note (Signed)
OPERATIVE NOTE  Date: 08/15/18  Preoperative Diagnosis: left ovarian mass, desires permanent sterility   Postoperative Diagnosis:  Left ovarian benign mass   Procedure(s) Performed: Robotic-assisted laparoscopic, left oophorectomy, bilateral salpingectomy  Surgeon: Everitt Amber, M.D.  Assistant Surgeon: Lahoma Crocker M.D. (an MD assistant was necessary for tissue manipulation, management of robotic instrumentation, retraction and positioning due to the complexity of the case and hospital policies).   Anesthesia: Gen. Endotracheal.  Specimens: Left ovary, bilateral fallopian tubes, pelvic washings  Estimated Blood Loss: <10 mL. Blood Replacement: None  Complications: none  Indication for Procedure:  6cm left ovarian cyst.   Operative Findings: left ovarian cyst (4cm), normal tubes bilaterally, normal right ovary. Retroverted uterus. Normal upper abdomen.   [place] pathology was consistent with benign ovarian cyst.   Procedure: The patient's taken to the operating room and placed under general endotracheal anesthesia testing difficulty. She is placed in a dorsolithotomy position and cervical acromial pad was placed. The arms were tucked with care taken to pad the olecranon process. And prepped and draped in usual sterile fashion. A uterine manipulator (zumi) was placed vaginally. A 3mm incision was made in the left upper quadrant palmer's point and a 5 mm Optiview trocar used to enter the abdomen under direct visualization. With entry into the abdomen and then maintenance of 15 mm of mercury the patient was placed in Trendelenburg position. An incision was made in the umbilicus and a 39mm trochar was placed through this site. Two incisions were made lateral to the umbilical incision in the left and right abdomen measuring 31mm. These incisions were made approximately 10 cm lateral to the umbilical incision. 8 mm robotic trochars were inserted. The robot was docked.  The abdomen was  inspected as was the pelvis.  Pelvic washings were obtained. An incision was made on the left pelvic side wall peritoneum parallel to the IP ligament and the retroperitoneal space entered. The left ureter was identified and the para-rectal space was developed. A window was created in the left broad ligament above the ureter. The left infundibulopelvic vessels were skeletonized cauterized and transected. The utero-ovarian ligaments similarly were cauterized and transected. Specimen was placed in an Endo Catch bag. The right fallopian tube was separated from the underlying ovary and bipolar energy was used to seal the tube at the cornua before it was amputated.  It was delivered through the assistant port and set for permanent pathology. The abdomen was copiously irrigated and drained and all operative sites inspected and hemostasis was assured  The robot was undocked. The contents of the left Endo Catch bag were first aspirated and then morcellated to facilitate removal from the abdominal cavity through the left upper quadrant incision.   The ports were all remove. The fascial closure at the umbilical incision and left upper quadrant port was made with 0 Vicryl.  All incisions were closed with a running subcuticular Monocryl suture. Dermabond was applied. Sponge, lap and needle counts were correct x 3.    The patient had sequential compression devices for VTE prophylaxis.         Disposition: PACU -stable         Condition: stable  Donaciano Eva, MD

## 2018-08-15 NOTE — Interval H&P Note (Signed)
History and Physical Interval Note:  08/15/2018 1:54 PM  Leslie Pittman  has presented today for surgery, with the diagnosis of LEFT OVARIAN MASS.  The various methods of treatment have been discussed with the patient and family. After consideration of risks, benefits and other options for treatment, the patient has consented to  Procedure(s): XI ROBOTIC ASSISTED BILATERAL SALPINGECTOMY, LEFT OOPHORECTOMY, POSSIBLE TOTAL LAPAROSCOPIC HYSTERECTOMY, POSSIBLE STAGING (Bilateral) as a surgical intervention.  The patient's history has been reviewed, patient examined, no change in status, stable for surgery.  I have reviewed the patient's chart and labs.  Questions were answered to the patient's satisfaction.     Thereasa Solo

## 2018-08-15 NOTE — Discharge Instructions (Signed)
08/15/2018  Return to work: 3 days to 1 week  Activity: 1. Be up and out of the bed during the day.  Take a nap if needed.  You may walk up steps but be careful and use the hand rail.  Stair climbing will tire you more than you think, you may need to stop part way and rest.   2. No lifting or straining for 2 weeks.  3. Do Not drive if you are taking narcotic pain medicine.  4. Shower daily.  Use soap and water on your incision and pat dry; don't rub.   5. No sexual activity and nothing in the vagina for 2 weeks.  Medications:  - Take ibuprofen and tylenol first line for pain control. Take these regularly (every 6 hours) to decrease the build up of pain.  - If necessary, for severe pain not relieved by ibuprofen, take percocet.  - While taking percocet you should take sennakot every night to reduce the likelihood of constipation. If this causes diarrhea, stop its use.  Diet: 1. Low sodium Heart Healthy Diet is recommended.  2. It is safe to use a laxative if you have difficulty moving your bowels.   Wound Care: 1. Keep clean and dry.  Shower daily.  Reasons to call the Doctor:   Fever - Oral temperature greater than 100.4 degrees Fahrenheit  Foul-smelling vaginal discharge  Difficulty urinating  Nausea and vomiting  Increased pain at the site of the incision that is unrelieved with pain medicine.  Difficulty breathing with or without chest pain  New calf pain especially if only on one side  Sudden, continuing increased vaginal bleeding with or without clots.   Follow-up: 1. See Everitt Amber in 3 weeks.  Contacts: For questions or concerns you should contact:  Dr. Everitt Amber at [phone] After hours and on week-ends call [phone] and ask to speak to the physician on call for Gynecologic Oncology    Unilateral Salpingo-Oophorectomy, Care After This sheet gives you information about how to care for yourself after your procedure. Your health care provider  may also give you more specific instructions. If you have problems or questions, contact your health care provider. What can I expect after the procedure? After the procedure, it is common to have:  Abdominal pain.  Some occasional vaginal bleeding (spotting).  Tiredness. Follow these instructions at home: Incision care   Keep your incision area and your bandage (dressing) clean and dry.  Follow instructions from your health care provider about how to take care of your incision. Make sure you: ? Wash your hands with soap and water before you change your dressing. If soap and water are not available, use hand sanitizer. ? Change your dressing as told by your health care provider. ? Leave stitches (sutures), staples, skin glue, or adhesive strips in place. These skin closures may need to stay in place for 2 weeks or longer. If adhesive strip edges start to loosen and curl up, you may trim the loose edges. Do not remove adhesive strips completely unless your health care provider tells you to do that.  Check your incision area every day for signs of infection. Check for: ? Redness, swelling, or pain. ? Fluid or blood. ? Warmth. ? Pus or a bad smell. Activity  Do not drive or use heavy machinery while taking prescription pain medicine.  Do not drive for 24 hours if you received a medicine to help you relax (sedative).  Take frequent, short walks  throughout the day. Rest when you get tired. Ask your health care provider what activities are safe for you.  Avoid activities that require great effort. Also, avoid heavy lifting. Do not lift anything that is heavier than 5 lb (2.3 kg), or the limit that your health care provider tells you, until he or she says that it is safe to do so.  Do not douche, use tampons, or have sex until your health care provider approves. General instructions  To prevent or treat constipation while you are taking prescription pain medicine, your health care  provider may recommend that you: ? Drink enough fluid to keep your urine pale yellow. ? Take over-the-counter or prescription medicines. ? Eat foods that are high in fiber, such as fresh fruits and vegetables, whole grains, and beans. ? Limit foods that are high in fat and processed sugars, such as fried and sweet foods.  Take over-the-counter and prescription medicines only as told by your health care provider.  Do not take baths, swim, or use a hot tub until your health care provider approves. Ask your health care provider if you may take showers. You may only be allowed to take sponge baths.  Wear compression stockings as told by your health care provider. These stockings help to prevent blood clots and reduce swelling in your legs.  Keep all follow-up visits as told by your health care provider. This is important. Contact a health care provider if:  You have pain when you urinate.  You have pus or a bad smelling discharge coming from your vagina.  You have redness, swelling, or pain around your incision.  You have fluid or blood coming from your incision.  Your incision feels warm to the touch.  You have pus or a bad smell coming from your incision.  You have a fever.  Your incision starts to break open.  You have abdominal pain that gets worse or does not get better with medicine.  You develop a rash.  You develop nausea and vomiting.  You feel lightheaded. Get help right away if:  You develop pain in your chest or leg.  You develop shortness of breath.  You faint.  You have increased bleeding from your vagina. Summary  After the procedure, it is common to have pain, tiredness, and occasional bleeding from the vagina.  Follow instructions from your health care provider about how to take care of your incision.  Check your incision every day for signs of infection and report any symptoms to your health care provider.  Follow instructions from your health care  provider about activities and restrictions. This information is not intended to replace advice given to you by your health care provider. Make sure you discuss any questions you have with your health care provider. Document Released: 03/18/2009 Document Revised: 08/31/2016 Document Reviewed: 08/31/2016 Elsevier Interactive Patient Education  2019 [employer].

## 2018-08-16 ENCOUNTER — Encounter (HOSPITAL_COMMUNITY): Payer: Self-pay | Admitting: Gynecologic Oncology

## 2018-08-16 ENCOUNTER — Telehealth: Payer: Self-pay

## 2018-08-16 NOTE — Telephone Encounter (Signed)
Outgoing call to patient per Joylene John NP " how is she doing?"  Pt reports she is doing well, "a little sore" - reports has not used any pain med only Tylenol or Ibuprofen as needed.   Reports she is drinking well, eating, voiding well, has not had a bm yet but said that is not unusual for her.  Reminded she should have prescription for Senna - pt said yes, she will use if needed.   Discussed basic incision care, can pad dry lightly after shower, no lotions in that area -could disrupt glue- pt voiced understanding.   Encouraged to call our office if any questions/ concerns, any fever or increased pain, incision concern- otherwise keep f/u appt for 09/06/2018 with Dr Denman George.  Pt voiced understanding. No other needs per pt at this time.

## 2018-08-20 ENCOUNTER — Telehealth: Payer: Self-pay

## 2018-08-20 NOTE — Telephone Encounter (Signed)
Told Leslie Pittman that the final surgical pathology was benign. Pt doing well. She has moved her bowels. She is using the Ibuprofen and Tylenol but not regularly alternating.  Pt is sore and suggested that she use the tylenol alt with Ibuprofen and she maybe more comfortable. She has not needed to use narcotic pain medication.

## 2018-08-28 ENCOUNTER — Telehealth: Payer: Self-pay | Admitting: *Deleted

## 2018-08-28 NOTE — Telephone Encounter (Signed)
Called and spoke with the patient regarding her appt on 4/3. Explained that the hospital is asking [place] to combine our clinics and move routine appts. Rescheduled her appt from 4/3 to 4/1. Explained that we will call her the day before the appt to pre-screen her and again the day of the appt at the front desk. Also explained the no visitor policy

## 2018-09-03 ENCOUNTER — Telehealth: Payer: Self-pay | Admitting: *Deleted

## 2018-09-03 NOTE — Telephone Encounter (Signed)
Called and spoke with patient regarding her appt for tomorrow.  Patient has had no signs/symptoms, traveled or had an exposure to COVID-19. Patient has not been around anyone sick or that has had an exposure. Explained that she will be asked the questions again tomorrow at the front desk; along with a temperature check. Explained the no visitor policy.

## 2018-09-03 NOTE — Progress Notes (Signed)
Follow-up Note: Gyn-Onc  Consult was initially requested by Dr. Royston Sinner for the evaluation of Leslie Pittman 48 y.o. female  CC:  Chief Complaint  Patient presents with  . Follicular cyst of left ovary    Assessment/Plan:  Leslie Pittman  is a 48 y.o.  year old who is s/p robotic LSO, right salpingectomy performed on 08/15/18 for a left ovarian cyst.  She is doing well postop. Recommend annual follow-up with Dr Royston Sinner for wellness and follow-up of her history of thickened endometrium. She has had no further bleeding.  HPI: Leslie Pittman is a 48 year old P2 who is seen in consultation at the request of Dr Royston Sinner for a complex left ovarian cyst.  The patient was being evaluated by Dr. Royston Sinner for abnormal uterine bleeding.  She had a period of amenorrhea for 2 months followed by an episode of menorrhagia.  This prompted a transvaginal ultrasound which was performed on Fabry 27th 2020.  This revealed a uterus measuring 7.9 x 4 x 5.8 cm with a 2.7 intramural fibroids seen.  There were no right adnexal masses seen.  The left ovary contained a 5.5 x 4.4 x 4.4 cm complex mass with thick septations.  It contained low-level echoes and a couple of solid areas with the largest solid area being 2.2 cm.  A small amount of blood flow was seen within the cyst.  No free fluid was seen.  Endometrial thickening was seen on the anterior wall of the endometrial cavity following saline infusion into the endometrium.  An endometrial biopsy was performed on Fabry 19 2020 and this revealed proliferative endometrium and benign endocervical mucosa.  A cervical polyp was also biopsy that was benign.  The patient was started on medroxyprogesterone which stopped her bleeding which was only spotting.  A Ca1 25 was drawn in the office on August 01, 2018.  It was 14.   The patient is otherwise very healthy.  She has had 2 prior vaginal deliveries and no abdominal surgeries.  Interval Hx:  On 08/15/18 she underwent robotic  assisted LSO and bilateral salpingectomy, with the final pathology revealed a hemorrhagic follicular cyst on the left ovary, and bilateral paratubal cyst.  There was no malignancy.  Current Meds:  Outpatient Encounter Medications as of 09/04/2018  Medication Sig  . amLODipine (NORVASC) 10 MG tablet Take 10 mg by mouth every evening.   . fexofenadine (ALLEGRA) 180 MG tablet Take 180 mg by mouth every evening.  . medroxyPROGESTERone (PROVERA) 10 MG tablet Take 10 mg by mouth daily.   . Multiple Vitamin (MULTIVITAMIN WITH MINERALS) TABS tablet Take 1 tablet by mouth daily.  . Multiple Vitamins-Minerals (HAIR/SKIN/NAILS) TABS Take 1 tablet by mouth daily.  . [DISCONTINUED] acetaminophen (TYLENOL) 325 MG tablet Take 2 tablets (650 mg total) by mouth every 6 (six) hours as needed.  . [DISCONTINUED] ibuprofen (ADVIL,MOTRIN) 600 MG tablet Take 1 tablet (600 mg total) by mouth every 6 (six) hours as needed for moderate pain. For AFTER surgery  . [DISCONTINUED] oxyCODONE (OXY IR/ROXICODONE) 5 MG immediate release tablet Take 1 tablet (5 mg total) by mouth every 4 (four) hours as needed for severe pain. For AFTER surgery, do not take and drive  . [DISCONTINUED] senna-docusate (SENOKOT-S) 8.6-50 MG tablet Take 2 tablets by mouth at bedtime. For AFTER surgery, do not take if having loose stools   No facility-administered encounter medications on file as of 09/04/2018.     Allergy:  Allergies  Allergen Reactions  .  Sulfa Antibiotics Anaphylaxis and Rash  . Penicillins Other (See Comments)    Did it involve swelling of the face/tongue/throat, SOB, or low BP? Unknown Did it involve sudden or severe rash/hives, skin peeling, or any reaction on the inside of your mouth or nose? Unknown Did you need to seek medical attention at a hospital or doctor's office? Unknown When did it last happen?childhood allergy If all above answers are "NO", may proceed with cephalosporin use.     Social Hx:   Social  History   Socioeconomic History  . Marital status: Legally Separated    Spouse name: Not on file  . Number of children: Not on file  . Years of education: Not on file  . Highest education level: Not on file  Occupational History  . Not on file  Social Needs  . Financial resource strain: Not on file  . Food insecurity:    Worry: Not on file    Inability: Not on file  . Transportation needs:    Medical: Not on file    Non-medical: Not on file  Tobacco Use  . Smoking status: Never Smoker  . Smokeless tobacco: Never Used  Substance and Sexual Activity  . Alcohol use: No  . Drug use: Never  . Sexual activity: Yes    Birth control/protection: Other-see comments    Comment: spouse had vas  Lifestyle  . Physical activity:    Days per week: Not on file    Minutes per session: Not on file  . Stress: Not on file  Relationships  . Social connections:    Talks on phone: Not on file    Gets together: Not on file    Attends religious service: Not on file    Active member of club or organization: Not on file    Attends meetings of clubs or organizations: Not on file    Relationship status: Not on file  . Intimate partner violence:    Fear of current or ex partner: Not on file    Emotionally abused: Not on file    Physically abused: Not on file    Forced sexual activity: Not on file  Other Topics Concern  . Not on file  Social History Narrative  . Not on file    Past Surgical Hx:  Past Surgical History:  Procedure Laterality Date  . BREAST BIOPSY Left 2014  approx.   benign  . ROBOTIC ASSISTED SALPINGO OOPHERECTOMY Bilateral 08/15/2018   Procedure: XI ROBOTIC ASSISTED LEFT SALPINGO-OOPHORECTOMY, RIGHT SALPINGECTOMY;  Surgeon: Everitt Amber, MD;  Location: [facility];  Service: Gynecology;  Laterality: Bilateral;  . WISDOM TOOTH EXTRACTION      Past Medical Hx:  Past Medical History:  Diagnosis Date  . Chronic constipation    occasional  . History of seizure    per pt x1 at  age 13 after eye dilatated ,  none since  . Hypertension   . Mass of left ovary   . Seasonal allergies   . Wears contact lenses     Past Gynecological History:  SVD x 2. Patient's last menstrual period was 08/07/2018 (exact date).  Family Hx:  Family History  Problem Relation Age of Onset  . Thyroid disease Mother   . Hypertension Father   . Hyperlipidemia Father   . Heart attack Paternal Uncle   . Hypertension Maternal Uncle   . Hypertension Maternal Grandmother   . Heart disease Paternal Uncle   . Hypertension Paternal Uncle   . Colon  cancer Paternal Grandmother     Review of Systems:  Constitutional  Feels well,    ENT Normal appearing ears and nares bilaterally Skin/Breast  No rash, sores, jaundice, itching, dryness Cardiovascular  No chest pain, shortness of breath, or edema  Pulmonary  No cough or wheeze.  Gastro Intestinal  No nausea, vomitting, or diarrhoea. No bright red blood per rectum, no abdominal pain, change in bowel movement, or constipation.  Genito Urinary  No frequency, urgency, dysuria, no bleeding Musculo Skeletal  No myalgia, arthralgia, joint swelling or pain  Neurologic  No weakness, numbness, change in gait,  Psychology  No depression, anxiety, insomnia.   Vitals:  Blood pressure 133/80, pulse 80, temperature 98.3 F (36.8 C), temperature source Oral, resp. rate 20, height 5\' 2"  (1.575 m), weight 111 lb (50.3 kg), last menstrual period 08/07/2018, SpO2 100 %.  Physical Exam: WD in NAD Neck  Supple NROM, without any enlargements.  Lymph Node Survey No cervical supraclavicular or inguinal adenopathy Cardiovascular  Pulse normal rate, regularity and rhythm. S1 and S2 normal.  Lungs  Clear to auscultation bilateraly, without wheezes/crackles/rhonchi. Good air movement.  Skin  No rash/lesions/breakdown  Psychiatry  Alert and oriented to person, place, and time  Abdomen  Normoactive bowel sounds, abdomen soft, non-tender and thin  without evidence of hernia. Incisions well healed Back No CVA tenderness Genito Urinary  deferred Rectal  Good tone, no masses no cul de sac nodularity.  Extremities  No bilateral cyanosis, clubbing or edema.   Leslie Solo, MD  09/04/2018, 4:29 PM

## 2018-09-04 ENCOUNTER — Inpatient Hospital Stay: Payer: BC Managed Care – PPO | Attending: Gynecologic Oncology | Admitting: Gynecologic Oncology

## 2018-09-04 ENCOUNTER — Other Ambulatory Visit: Payer: Self-pay

## 2018-09-04 ENCOUNTER — Encounter: Payer: Self-pay | Admitting: Gynecologic Oncology

## 2018-09-04 VITALS — BP 133/80 | HR 80 | Temp 98.3°F | Resp 20 | Ht 62.0 in | Wt 111.0 lb

## 2018-09-04 DIAGNOSIS — N8302 Follicular cyst of left ovary: Secondary | ICD-10-CM | POA: Diagnosis not present

## 2018-09-04 DIAGNOSIS — N83202 Unspecified ovarian cyst, left side: Secondary | ICD-10-CM | POA: Diagnosis present

## 2018-09-04 NOTE — Patient Instructions (Signed)
Dr Denman George removed your left tube and ovary and right ovary. No cancerous growths were found. There was a benign (noncancerous) cyst on the left ovary.  You had a procedure (removal of the tubes) that means that you cannot get pregnant but will continue to make hormones from the right ovary until you pass through menopause, therefore you will continue to have menstrual cycles until menopause.   You are cleared to resume all activities in 1 week.   Please follow-up with Dr Royston Sinner for routine gynecologic care.  Dr Denman George can be reached at [phone].

## 2018-09-06 ENCOUNTER — Ambulatory Visit: Payer: BC Managed Care – PPO | Admitting: Gynecologic Oncology

## 2018-11-04 ENCOUNTER — Telehealth: Payer: Self-pay | Admitting: Plastic Surgery

## 2018-11-04 NOTE — Telephone Encounter (Signed)

## 2018-11-05 ENCOUNTER — Encounter: Payer: Self-pay | Admitting: Plastic Surgery

## 2018-11-05 ENCOUNTER — Ambulatory Visit: Payer: BC Managed Care – PPO | Admitting: Plastic Surgery

## 2018-11-05 ENCOUNTER — Other Ambulatory Visit: Payer: Self-pay

## 2018-11-05 ENCOUNTER — Other Ambulatory Visit (HOSPITAL_COMMUNITY)
Admission: RE | Admit: 2018-11-05 | Discharge: 2018-11-05 | Disposition: A | Discharge status: Home / Self Care | Payer: BC Managed Care – PPO | Source: Ambulatory Visit | Attending: Plastic Surgery | Admitting: Plastic Surgery

## 2018-11-05 VITALS — BP 151/80 | HR 77 | Temp 99.1°F | Ht 62.0 in | Wt 114.0 lb

## 2018-11-05 DIAGNOSIS — L989 Disorder of the skin and subcutaneous tissue, unspecified: Secondary | ICD-10-CM | POA: Diagnosis not present

## 2018-11-05 NOTE — Progress Notes (Signed)
Preoperative Dx: changing skin lesion chin  Postoperative Dx: Same  Procedure: Excision of changing skin lesion chin 5 mm  Surgeon: Dr. Lyndee Leo Dillingham  Anesthesia: Lidocaine 1% with 1:100,000 epinepherine  Indication for Procedure: skin lesion  Description of Procedure: Risks and complications were explained to the patient.  Consent was confirmed and all information was confirmed to be correct.  The area was prepped with betadine and drapped.  Lidocaine 1% with epinepherine was injected in the subcutaneous area.  After waiting several minutes for the lidocaine to take affect a #15 blade was used to excise the area in an eliptical pattern.  A 6-0 Monocryl was used to close the skin edges.  The patient is to follow up in one week.  She tolerated the procedure well and there were no complications. The specimen was sent to pathology.

## 2018-11-05 NOTE — Addendum Note (Signed)
Addended by: Wallace Going on: 11/05/2018 04:19 PM   Modules accepted: Orders

## 2018-11-14 ENCOUNTER — Telehealth: Payer: Self-pay | Admitting: Plastic Surgery

## 2018-11-14 NOTE — Telephone Encounter (Signed)

## 2018-11-15 ENCOUNTER — Encounter: Payer: Self-pay | Admitting: Plastic Surgery

## 2018-11-15 ENCOUNTER — Other Ambulatory Visit: Payer: Self-pay

## 2018-11-15 ENCOUNTER — Ambulatory Visit (INDEPENDENT_AMBULATORY_CARE_PROVIDER_SITE_OTHER): Payer: BC Managed Care – PPO | Admitting: Plastic Surgery

## 2018-11-15 VITALS — BP 123/77 | HR 72 | Temp 98.7°F | Ht 61.5 in | Wt 114.0 lb

## 2018-11-15 DIAGNOSIS — L989 Disorder of the skin and subcutaneous tissue, unspecified: Secondary | ICD-10-CM

## 2018-11-15 NOTE — Progress Notes (Signed)
The patient is a 48 yrs old wf here for follow up on her chin lesion excision.  The path showed a melanocytic nevus margins free.  The area is healing well.  No sign of infection.  Sutures removed.  Derma bond applied.  Follow up as needed.

## 2019-07-03 ENCOUNTER — Encounter (HOSPITAL_BASED_OUTPATIENT_CLINIC_OR_DEPARTMENT_OTHER): Payer: Self-pay | Admitting: Obstetrics and Gynecology

## 2019-07-03 ENCOUNTER — Other Ambulatory Visit: Payer: Self-pay

## 2019-07-03 NOTE — Progress Notes (Signed)
Spoke w/ via phone for pre-op interview--- PT Lab needs dos----  Isat 8 and Urine preg (anes.)  / pre-op orders pending             Lab results------ current ekg in chart/ epic COVID test ------ 07-04-2019 @ [id number] Arrive at ------- 0530 NPO after ------ MN Medications to take morning of surgery ----- NONE Diabetic medication ----- n/a Patient Special Instructions ----- n/a Pre-Op special Istructions -----n/a Patient verbalized understanding of instructions that were given at this phone interview. Patient denies shortness of breath, chest pain, fever, cough a this phone interview.

## 2019-07-04 ENCOUNTER — Other Ambulatory Visit (HOSPITAL_COMMUNITY)
Admission: RE | Admit: 2019-07-04 | Discharge: 2019-07-04 | Disposition: A | Discharge status: Home / Self Care | Payer: BC Managed Care – PPO | Source: Ambulatory Visit | Attending: Obstetrics and Gynecology | Admitting: Obstetrics and Gynecology

## 2019-07-04 DIAGNOSIS — Z20822 Contact with and (suspected) exposure to covid-19: Secondary | ICD-10-CM | POA: Insufficient documentation

## 2019-07-04 DIAGNOSIS — Z01812 Encounter for preprocedural laboratory examination: Secondary | ICD-10-CM | POA: Diagnosis not present

## 2019-07-04 LAB — SARS CORONAVIRUS 2 (TAT 6-24 HRS): SARS Coronavirus 2: NEGATIVE

## 2019-07-04 NOTE — H&P (Signed)
Leslie Pittman is an 49 y.o. female. Presenting for management of recurrent Bartholin's abscess. She originally was diagnosed in 9-10 years ago and had an I&D at that time. The area subsequently became infected and was treated with antibiotics. She then had a word catheter that was ultimately successful. She presented to me with recurrent abscess several weeks ago that had begun to spontaneously drain copiously. Culture was sent and showed no growth. She was treated with doxycyline. I&D was not done given area spontaneously draining. Now it has persisted despite the above measures and patient desires surgical management.  She has a hx of HTN that was well controlled at last office visit. She takes Amlodipine.   Pertinent Gynecological History: Contraception: tubal ligation Last pap: normal Date: 2019 OB History: G2, P2002   Menstrual History: Patient's last menstrual period was 05/14/2019.    Past Medical History:  Diagnosis Date  . Bartholin cyst   . Chronic constipation    occasional  . History of seizure    per pt x1 at age 65 after eye dilatated ,  none since  . Hypertension    followed by pcp   (07-03-2019 per pt never had stress test)  . Seasonal allergies   . Wears contact lenses     Past Surgical History:  Procedure Laterality Date  . BREAST BIOPSY Left 2014  approx.   benign  . ROBOTIC ASSISTED SALPINGO OOPHERECTOMY Bilateral 08/15/2018   Procedure: XI ROBOTIC ASSISTED LEFT SALPINGO-OOPHORECTOMY, RIGHT SALPINGECTOMY;  Surgeon: Everitt Amber, MD;  Location: [facility];  Service: Gynecology;  Laterality: Bilateral;  . WISDOM TOOTH EXTRACTION      Family History  Problem Relation Age of Onset  . Thyroid disease Mother   . Hypertension Father   . Hyperlipidemia Father   . Heart attack Paternal Uncle   . Hypertension Maternal Uncle   . Hypertension Maternal Grandmother   . Heart disease Paternal Uncle   . Hypertension Paternal Uncle   . Colon cancer Paternal Grandmother      Social History:  reports that she has never smoked. She has never used smokeless tobacco. She reports that she does not drink alcohol or use drugs.  Allergies:  Allergies  Allergen Reactions  . Sulfa Antibiotics Anaphylaxis and Rash  . Penicillins Other (See Comments)    Did it involve swelling of the face/tongue/throat, SOB, or low BP? Unknown Did it involve sudden or severe rash/hives, skin peeling, or any reaction on the inside of your mouth or nose? Unknown Did you need to seek medical attention at a hospital or doctor's office? Unknown When did it last happen?childhood allergy If all above answers are "NO", may proceed with cephalosporin use.     No medications prior to admission.    Review of Systems  Height 5' 1.5" (1.562 m), weight 52.2 kg, last menstrual period 05/14/2019. Physical Exam  Gen: well appearing, NAD CV: Reg rate Pulm: NWOB Abd: soft, nondistended, nontender, no masses GYN: uterus 6 week size, no adnexa ttp/CMT. Left sided bartholin's gland cyst palpated 0.5cm in size spontaneously draining white fluid Ext: No edema b/l   No results found for this or any previous visit (from the past 24 hour(s)).  No results found.  Assessment/Plan: 49 yo G2P2 presenting for surgical management of bartholin's gland abscess after failing conservative management and having several recurrences. Plan for marsupialization with biopsy of gland given >45 yo vs gland excision depending on presentation on day of procedure. Risks discussed including infection, bleeding, damage to  surrounding structures, unexpected carcinoma finding, need for additional procedures, recurrence, scarring, dyspareunia, and failure. All questions answered.   Colin Benton Dominick Zertuche 07/04/2019, 11:12 AM

## 2019-07-07 NOTE — Anesthesia Preprocedure Evaluation (Addendum)
Anesthesia Evaluation  Patient identified by MRN, date of birth, ID band Patient awake    Reviewed: Allergy & Precautions, NPO status , Patient's Chart, lab work & pertinent test results  Airway Mallampati: II  TM Distance: >3 FB Neck ROM: Full    Dental no notable dental hx. (+) Teeth Intact, Dental Advisory Given   Pulmonary neg pulmonary ROS,    Pulmonary exam normal breath sounds clear to auscultation       Cardiovascular hypertension, Pt. on medications Normal cardiovascular exam Rhythm:Regular Rate:Normal     Neuro/Psych negative neurological ROS     GI/Hepatic negative GI ROS,   Endo/Other  negative endocrine ROS  Renal/GU K+ 3.2     Musculoskeletal negative musculoskeletal ROS (+)   Abdominal   Peds  Hematology Hgb 15.0   Anesthesia Other Findings   Reproductive/Obstetrics (+) Pregnancy                            Anesthesia Physical Anesthesia Plan  ASA: II  Anesthesia Plan: General   Post-op Pain Management:    Induction:   PONV Risk Score and Plan: 4 or greater and Treatment may vary due to age or medical condition, Ondansetron, Dexamethasone and Midazolam  Airway Management Planned: LMA  Additional Equipment: None  Intra-op Plan:   Post-operative Plan:   Informed Consent: I have reviewed the patients History and Physical, chart, labs and discussed the procedure including the risks, benefits and alternatives for the proposed anesthesia with the patient or authorized representative who has indicated his/her understanding and acceptance.     Dental advisory given  Plan Discussed with:   Anesthesia Plan Comments:        Anesthesia Quick Evaluation

## 2019-07-08 ENCOUNTER — Ambulatory Visit (HOSPITAL_BASED_OUTPATIENT_CLINIC_OR_DEPARTMENT_OTHER)
Admission: RE | Admit: 2019-07-08 | Discharge: 2019-07-08 | Disposition: A | Discharge status: Home / Self Care | Payer: BC Managed Care – PPO | Attending: Obstetrics and Gynecology | Admitting: Obstetrics and Gynecology

## 2019-07-08 ENCOUNTER — Other Ambulatory Visit: Payer: Self-pay

## 2019-07-08 ENCOUNTER — Encounter (HOSPITAL_BASED_OUTPATIENT_CLINIC_OR_DEPARTMENT_OTHER)
Admission: RE | Disposition: A | Discharge status: Home / Self Care | Payer: Self-pay | Source: Home / Self Care | Attending: Obstetrics and Gynecology

## 2019-07-08 ENCOUNTER — Encounter (HOSPITAL_BASED_OUTPATIENT_CLINIC_OR_DEPARTMENT_OTHER): Payer: Self-pay | Admitting: Obstetrics and Gynecology

## 2019-07-08 ENCOUNTER — Ambulatory Visit (HOSPITAL_BASED_OUTPATIENT_CLINIC_OR_DEPARTMENT_OTHER): Payer: BC Managed Care – PPO | Admitting: Anesthesiology

## 2019-07-08 DIAGNOSIS — Z8249 Family history of ischemic heart disease and other diseases of the circulatory system: Secondary | ICD-10-CM | POA: Diagnosis not present

## 2019-07-08 DIAGNOSIS — Z90721 Acquired absence of ovaries, unilateral: Secondary | ICD-10-CM | POA: Insufficient documentation

## 2019-07-08 DIAGNOSIS — Z9079 Acquired absence of other genital organ(s): Secondary | ICD-10-CM | POA: Diagnosis not present

## 2019-07-08 DIAGNOSIS — N751 Abscess of Bartholin's gland: Secondary | ICD-10-CM | POA: Diagnosis not present

## 2019-07-08 DIAGNOSIS — I10 Essential (primary) hypertension: Secondary | ICD-10-CM | POA: Diagnosis not present

## 2019-07-08 DIAGNOSIS — K5909 Other constipation: Secondary | ICD-10-CM | POA: Insufficient documentation

## 2019-07-08 DIAGNOSIS — Z800 Family history of malignant neoplasm of digestive organs: Secondary | ICD-10-CM | POA: Diagnosis not present

## 2019-07-08 DIAGNOSIS — Z880 Allergy status to penicillin: Secondary | ICD-10-CM | POA: Diagnosis not present

## 2019-07-08 DIAGNOSIS — Z8349 Family history of other endocrine, nutritional and metabolic diseases: Secondary | ICD-10-CM | POA: Diagnosis not present

## 2019-07-08 HISTORY — PX: BARTHOLIN GLAND CYST EXCISION: SHX565

## 2019-07-08 HISTORY — DX: Cyst of Bartholin's gland: N75.0

## 2019-07-08 LAB — POCT I-STAT, CHEM 8
BUN: 15 mg/dL (ref 6–20)
Calcium, Ion: 1.21 mmol/L (ref 1.15–1.40)
Chloride: 102 mmol/L (ref 98–111)
Creatinine, Ser: 0.7 mg/dL (ref 0.44–1.00)
Glucose, Bld: 103 mg/dL — ABNORMAL HIGH (ref 70–99)
HCT: 44 % (ref 36.0–46.0)
Hemoglobin: 15 g/dL (ref 12.0–15.0)
Potassium: 3.2 mmol/L — ABNORMAL LOW (ref 3.5–5.1)
Sodium: 141 mmol/L (ref 135–145)
TCO2: 28 mmol/L (ref 22–32)

## 2019-07-08 LAB — POCT PREGNANCY, URINE: Preg Test, Ur: NEGATIVE

## 2019-07-08 LAB — TYPE AND SCREEN
ABO/RH(D): O POS
Antibody Screen: NEGATIVE

## 2019-07-08 SURGERY — EXCISION, BARTHOLIN'S GLAND
Anesthesia: General | Site: Vagina

## 2019-07-08 MED ORDER — OXYCODONE HCL 5 MG PO TABS
ORAL_TABLET | ORAL | Status: AC
  Filled 2019-07-08: qty 1

## 2019-07-08 MED ORDER — LIDOCAINE HCL 1 % IJ SOLN
INTRAMUSCULAR | Status: DC | PRN
  Administered 2019-07-08: 4 mL

## 2019-07-08 MED ORDER — ONDANSETRON HCL 4 MG/2ML IJ SOLN
INTRAMUSCULAR | Status: AC
  Filled 2019-07-08: qty 2

## 2019-07-08 MED ORDER — PROPOFOL 500 MG/50ML IV EMUL
INTRAVENOUS | Status: DC | PRN
  Administered 2019-07-08: 120 mg via INTRAVENOUS

## 2019-07-08 MED ORDER — DEXAMETHASONE SODIUM PHOSPHATE 10 MG/ML IJ SOLN
INTRAMUSCULAR | Status: AC
  Filled 2019-07-08: qty 1

## 2019-07-08 MED ORDER — LIDOCAINE 2% (20 MG/ML) 5 ML SYRINGE
INTRAMUSCULAR | Status: AC
  Filled 2019-07-08: qty 5

## 2019-07-08 MED ORDER — FENTANYL CITRATE (PF) 100 MCG/2ML IJ SOLN
INTRAMUSCULAR | Status: DC | PRN
  Administered 2019-07-08: 25 ug via INTRAVENOUS
  Administered 2019-07-08: 50 ug via INTRAVENOUS

## 2019-07-08 MED ORDER — LIDOCAINE 2% (20 MG/ML) 5 ML SYRINGE
INTRAMUSCULAR | Status: DC | PRN
  Administered 2019-07-08: 100 mg via INTRAVENOUS

## 2019-07-08 MED ORDER — CLINDAMYCIN PHOSPHATE 600 MG/50ML IV SOLN
INTRAVENOUS | Status: DC | PRN
  Administered 2019-07-08: 400 mg via INTRAVENOUS

## 2019-07-08 MED ORDER — DEXAMETHASONE SODIUM PHOSPHATE 10 MG/ML IJ SOLN
INTRAMUSCULAR | Status: DC | PRN
  Administered 2019-07-08: 10 mg via INTRAVENOUS

## 2019-07-08 MED ORDER — ONDANSETRON HCL 4 MG/2ML IJ SOLN
INTRAMUSCULAR | Status: DC | PRN
  Administered 2019-07-08: 4 mg via INTRAVENOUS

## 2019-07-08 MED ORDER — PROPOFOL 10 MG/ML IV BOLUS
INTRAVENOUS | Status: AC
  Filled 2019-07-08: qty 20

## 2019-07-08 MED ORDER — IBUPROFEN 600 MG PO TABS
600.0000 mg | ORAL_TABLET | Freq: Once | ORAL | Status: AC
  Administered 2019-07-08: 600 mg via ORAL
  Filled 2019-07-08: qty 1

## 2019-07-08 MED ORDER — CLINDAMYCIN HCL 300 MG PO CAPS: 300.0000 mg | ORAL_CAPSULE | Freq: Three times a day (TID) | ORAL | 0 refills | Status: AC

## 2019-07-08 MED ORDER — LACTATED RINGERS IV SOLN
INTRAVENOUS | Status: DC
  Filled 2019-07-08: qty 1000

## 2019-07-08 MED ORDER — IBUPROFEN 200 MG PO TABS
ORAL_TABLET | ORAL | Status: AC
  Filled 2019-07-08: qty 3

## 2019-07-08 MED ORDER — GENTAMICIN IN SALINE 1.6-0.9 MG/ML-% IV SOLN
80.0000 mg | Freq: Once | INTRAVENOUS | Status: AC
  Administered 2019-07-08: 08:00:00 80 mg via INTRAVENOUS
  Filled 2019-07-08: qty 50

## 2019-07-08 MED ORDER — CLINDAMYCIN PHOSPHATE 600 MG/50ML IV SOLN
INTRAVENOUS | Status: AC
  Filled 2019-07-08: qty 50

## 2019-07-08 MED ORDER — FENTANYL CITRATE (PF) 100 MCG/2ML IJ SOLN
INTRAMUSCULAR | Status: AC
  Filled 2019-07-08: qty 2

## 2019-07-08 MED ORDER — MIDAZOLAM HCL 2 MG/2ML IJ SOLN
INTRAMUSCULAR | Status: DC | PRN
  Administered 2019-07-08: 2 mg via INTRAVENOUS

## 2019-07-08 MED ORDER — MIDAZOLAM HCL 2 MG/2ML IJ SOLN
INTRAMUSCULAR | Status: AC
  Filled 2019-07-08: qty 2

## 2019-07-08 SURGICAL SUPPLY — 17 items
ELECT REM PT RETURN 9FT ADLT (ELECTROSURGICAL) ×2
ELECTRODE REM PT RTRN 9FT ADLT (ELECTROSURGICAL) ×1 IMPLANT
GLOVE BIO SURGEON STRL SZ 6.5 (GLOVE) ×2 IMPLANT
GLOVE BIOGEL PI IND STRL 7.0 (GLOVE) ×2 IMPLANT
GLOVE BIOGEL PI INDICATOR 7.0 (GLOVE) ×2
GLOVE ECLIPSE 6.5 STRL STRAW (GLOVE) ×2 IMPLANT
GOWN STRL REUS W/ TWL LRG LVL3 (GOWN DISPOSABLE) ×2 IMPLANT
GOWN STRL REUS W/TWL LRG LVL3 (GOWN DISPOSABLE) ×4
HIBICLENS CHG 4% 4OZ BTL (MISCELLANEOUS) ×2 IMPLANT
NEEDLE HYPO 22GX1.5 SAFETY (NEEDLE) ×2 IMPLANT
PACK VAGINAL MINOR WOMEN LF (CUSTOM PROCEDURE TRAY) ×2 IMPLANT
PAD OB MATERNITY 4.3X12.25 (PERSONAL CARE ITEMS) ×2 IMPLANT
PENCIL BUTTON HOLSTER BLD 10FT (ELECTRODE) ×2 IMPLANT
SUT CHROMIC 5 0 P 3 (SUTURE) ×2 IMPLANT
SUT VIC AB 3-0 SH 27 (SUTURE) ×6
SUT VIC AB 3-0 SH 27X BRD (SUTURE) ×1 IMPLANT
TOWEL OR 17X24 6PK STRL BLUE (TOWEL DISPOSABLE) ×4 IMPLANT

## 2019-07-08 NOTE — Anesthesia Procedure Notes (Signed)
Procedure Name: LMA Insertion Date/Time: 07/08/2019 7:23 AM Performed by: Gerald Leitz, CRNA Pre-anesthesia Checklist: Patient identified, Patient being monitored, Timeout performed, Emergency Drugs available and Suction available Patient Re-evaluated:Patient Re-evaluated prior to induction Oxygen Delivery Method: Circle system utilized Preoxygenation: Pre-oxygenation with 100% oxygen Induction Type: IV induction Ventilation: Mask ventilation without difficulty LMA: LMA inserted LMA Size: 4.0 Tube type: Oral Number of attempts: 1 Placement Confirmation: positive ETCO2 and breath sounds checked- equal and bilateral Tube secured with: Tape Dental Injury: Teeth and Oropharynx as per pre-operative assessment

## 2019-07-08 NOTE — Transfer of Care (Signed)
Immediate Anesthesia Transfer of Care Note  Patient: Leslie Pittman  Procedure(s) Performed: Procedure(s): BARTHOLIN GLAND EXCISION (N/A)  Patient Location: PACU  Anesthesia Type:General  Level of Consciousness: Alert, Awake, Oriented  Airway & Oxygen Therapy: Patient Spontanous Breathing  Post-op Assessment: Report given to RN  Post vital signs: Reviewed and stable  Last Vitals:  Vitals:   07/08/19 0629 07/08/19 0809  BP: 134/88   Pulse: 87   Resp: 16   Temp: 36.7 C (!) 36.4 C  SpO2: 123XX123     Complications: No apparent anesthesia complications

## 2019-07-08 NOTE — Op Note (Signed)
DATE OF OPERATION:  07/08/19  PREOPERATIVE DIAGNOSIS:  recurrent Bartholin gland abscess.  POSTOPERATIVE DIAGNOSIS:  recurrent Bartholin gland abscess.  OPERATION PERFORMED:  Removal of portion of bartholin's gland and Marsupialization of adherent Bartholin's cyst   SURGEON:  Lucillie Garfinkel MD  ASSISTANT:  none  ANESTHESIA:  General.  ESTIMATED BLOOD LOSS:  Negligible.  Specimen: culture  COMPLICATIONS:  None.  INDICATIONS FOR OPERATION AND OPERATIVE FINDINGS:  The patient was seen in the office prior to admission, at which time a large Bartholin gland cyst was appreciated. Due to recurrence and >49 yo, it was decided that it should be removed or at least biopsied. Examination under anesthesia again noted a left Bartholin gland abscess, approximately 2 cm.   DESCRIPTION OF OPERATION:  The patient was brought to the operating room in stable condition and placed on the operating room table in the dorsal supine position. The patient was then administered general anesthesia without difficulty. The patient was then repositioned in the dorsal lithotomy position. Exam under anesthesia was performed. The findings on examination are delineated above. The patient was then perineally prepped and draped.  Gent and clinda were given b/c of purulent drainage.   With the use of an 11 blade, a skin incision was made in the mucocutaneous junction, approximately 1 cm. Cultures were obtained. The incision was then enlarged to approximately 3 cm, and the purulent drainage was noted. The cyst was then liberally irrigated. There were no loculations. Minor inflammatory changes were noted involving the cyst wall but cyst wall completely adherent to right side of vaginal skin. Using a series of blunt and sharp dissection, the left portion of the cyst and the base of the gland was excised and sent to pathology. The right cyst wall was completely adherent and unable to safely be removed. At this time, the elliptical  incision was then sutured in the classical fashion, suturing the remaining mucosal lining of the cyst to the skin with interrupted 3-0 Vicryl suture at strategic intervals approximately 0.5 cm apart. Good hemostasis was achieved.2 interrupted stitches placed to close dead space and bring skin together, leaving ~1cm opening to prevent hematoma formation. Good hemostasis was present, and no further procedures were performed. All instruments were removed. All gauze packing was removed from the vagina and the vagina irrigated.  At the conclusion of the procedure, the site was hemostatic. The patient was then repositioned to the dorsal supine position and awakened from general anesthesia. The patient was transferred to the stretcher and taken to the postanesthesia recovery unit in good condition. There were no complications associated with this procedure.  Clindamycin was prescribed.

## 2019-07-08 NOTE — Progress Notes (Signed)
No updates to above H&P. Patient arrived NPO and was consented in PACU. Risks again discussed, all questions answered, and consent signed. Proceed with above surgery.    Jaeley Wiker MD  

## 2019-07-08 NOTE — Anesthesia Postprocedure Evaluation (Signed)
Anesthesia Post Note  Patient: Leslie Pittman  Procedure(s) Performed: BARTHOLIN GLAND EXCISION (N/A Vagina )     Patient location during evaluation: PACU Anesthesia Type: General Level of consciousness: awake and alert Pain management: pain level controlled Vital Signs Assessment: post-procedure vital signs reviewed and stable Respiratory status: spontaneous breathing, nonlabored ventilation, respiratory function stable and patient connected to nasal cannula oxygen Cardiovascular status: blood pressure returned to baseline and stable Postop Assessment: no apparent nausea or vomiting Anesthetic complications: no    Last Vitals:  Vitals:   07/08/19 0845 07/08/19 0935  BP: 123/74 117/72  Pulse: 72 71  Resp: 16 16  Temp:  36.5 C  SpO2: 100% 100%    Last Pain:  Vitals:   07/08/19 0935  TempSrc: Oral  PainSc: 3                  Barnet Glasgow

## 2019-07-08 NOTE — Discharge Instructions (Signed)

## 2019-07-10 LAB — SURGICAL PATHOLOGY

## 2019-07-13 LAB — AEROBIC/ANAEROBIC CULTURE W GRAM STAIN (SURGICAL/DEEP WOUND)

## 2019-09-02 ENCOUNTER — Ambulatory Visit: Payer: BC Managed Care – PPO | Attending: Internal Medicine

## 2019-09-02 ENCOUNTER — Other Ambulatory Visit: Payer: Self-pay

## 2019-09-02 DIAGNOSIS — Z20822 Contact with and (suspected) exposure to covid-19: Secondary | ICD-10-CM

## 2019-09-03 LAB — NOVEL CORONAVIRUS, NAA: SARS-CoV-2, NAA: NOT DETECTED

## 2019-09-03 LAB — SARS-COV-2, NAA 2 DAY TAT

## 2019-10-29 ENCOUNTER — Other Ambulatory Visit: Payer: Self-pay | Admitting: Obstetrics and Gynecology

## 2019-10-29 DIAGNOSIS — R928 Other abnormal and inconclusive findings on diagnostic imaging of breast: Secondary | ICD-10-CM

## 2019-11-07 ENCOUNTER — Other Ambulatory Visit: Payer: Self-pay

## 2019-11-07 ENCOUNTER — Ambulatory Visit
Admission: RE | Admit: 2019-11-07 | Discharge: 2019-11-07 | Disposition: A | Discharge status: Home / Self Care | Payer: BC Managed Care – PPO | Source: Ambulatory Visit | Attending: Obstetrics and Gynecology | Admitting: Obstetrics and Gynecology

## 2019-11-07 ENCOUNTER — Other Ambulatory Visit: Payer: Self-pay | Admitting: Obstetrics and Gynecology

## 2019-11-07 DIAGNOSIS — R928 Other abnormal and inconclusive findings on diagnostic imaging of breast: Secondary | ICD-10-CM

## 2019-11-07 DIAGNOSIS — R921 Mammographic calcification found on diagnostic imaging of breast: Secondary | ICD-10-CM

## 2020-05-12 ENCOUNTER — Ambulatory Visit
Admission: RE | Admit: 2020-05-12 | Discharge: 2020-05-12 | Disposition: A | Discharge status: Home / Self Care | Payer: BC Managed Care – PPO | Source: Ambulatory Visit | Attending: Obstetrics and Gynecology | Admitting: Obstetrics and Gynecology

## 2020-05-12 ENCOUNTER — Other Ambulatory Visit: Payer: Self-pay

## 2020-05-12 DIAGNOSIS — R921 Mammographic calcification found on diagnostic imaging of breast: Secondary | ICD-10-CM

## 2020-12-20 ENCOUNTER — Other Ambulatory Visit: Payer: Self-pay | Admitting: Obstetrics and Gynecology

## 2020-12-20 DIAGNOSIS — R921 Mammographic calcification found on diagnostic imaging of breast: Secondary | ICD-10-CM

## 2021-01-24 ENCOUNTER — Ambulatory Visit
Admission: RE | Admit: 2021-01-24 | Discharge: 2021-01-24 | Disposition: A | Discharge status: Home / Self Care | Payer: BC Managed Care – PPO | Source: Ambulatory Visit | Attending: Obstetrics and Gynecology | Admitting: Obstetrics and Gynecology

## 2021-01-24 ENCOUNTER — Other Ambulatory Visit: Payer: Self-pay

## 2021-01-24 ENCOUNTER — Other Ambulatory Visit: Payer: Self-pay | Admitting: Obstetrics and Gynecology

## 2021-01-24 DIAGNOSIS — R928 Other abnormal and inconclusive findings on diagnostic imaging of breast: Secondary | ICD-10-CM

## 2021-01-24 DIAGNOSIS — R921 Mammographic calcification found on diagnostic imaging of breast: Secondary | ICD-10-CM

## 2022-01-03 ENCOUNTER — Other Ambulatory Visit: Payer: Self-pay | Admitting: Obstetrics and Gynecology

## 2022-01-03 DIAGNOSIS — Z1231 Encounter for screening mammogram for malignant neoplasm of breast: Secondary | ICD-10-CM

## 2022-01-25 ENCOUNTER — Ambulatory Visit
Admission: RE | Admit: 2022-01-25 | Discharge: 2022-01-25 | Disposition: A | Discharge status: Home / Self Care | Payer: BC Managed Care – PPO | Source: Ambulatory Visit | Attending: Obstetrics and Gynecology | Admitting: Obstetrics and Gynecology

## 2022-01-25 DIAGNOSIS — Z1231 Encounter for screening mammogram for malignant neoplasm of breast: Secondary | ICD-10-CM

## 2022-05-06 ENCOUNTER — Telehealth: Payer: BC Managed Care – PPO | Admitting: Nurse Practitioner

## 2022-05-06 DIAGNOSIS — H109 Unspecified conjunctivitis: Secondary | ICD-10-CM

## 2022-05-06 MED ORDER — POLYMYXIN B-TRIMETHOPRIM 10000-0.1 UNIT/ML-% OP SOLN: 1.0000 [drp] | OPHTHALMIC | 0 refills | Status: AC

## 2022-05-06 NOTE — Progress Notes (Signed)
IGildardo Pounds, NP, attempted to connect with Leslie Pittman; MRN [id number] on 05/06/22 via [employer] to complete a video urgent care visit. The patient was unable to successfully connect to the video platform. As such, the video visit was continued through phone call.    Virtual Visit Consent   Leslie Pittman, you are scheduled for a virtual visit with a [facility] provider today. Just as with appointments in the office, your consent must be obtained to participate. Your consent will be active for this visit and any virtual visit you may have with one of our providers in the next 365 days. If you have a MyChart account, a copy of this consent can be sent to you electronically.  As this is a virtual visit, video technology does not allow for your provider to perform a traditional examination. This may limit your provider's ability to fully assess your condition. If your provider identifies any concerns that need to be evaluated in person or the need to arrange testing (such as labs, EKG, etc.), we will make arrangements to do so. Although advances in technology are sophisticated, we cannot ensure that it will always work on either your end or our end. If the connection with a video visit is poor, the visit may have to be switched to a telephone visit. With either a video or telephone visit, we are not always able to ensure that we have a secure connection.  By engaging in this virtual visit, you consent to the provision of healthcare and authorize for your insurance to be billed (if applicable) for the services provided during this visit. Depending on your insurance coverage, you may receive a charge related to this service.  I need to obtain your verbal consent now. Are you willing to proceed with your visit today? Leslie Pittman has provided verbal consent on 05/06/2022 for a virtual visit (video or telephone). Gildardo Pounds, NP  Date: 05/06/2022 11:24 AM  Virtual Visit via Video Note   I,  Gildardo Pounds, connected with  Leslie Pittman  ([id number], 05/01/1971) on 05/06/22 at 11:15 AM EST by a video-enabled telemedicine application and verified that I am speaking with the correct person using two identifiers.  Location: Patient: Virtual Visit Location Patient: Home Provider: Virtual Visit Location Provider: Home Office   I discussed the limitations of evaluation and management by telemedicine and the availability of in person appointments. The patient expressed understanding and agreed to proceed.    History of Present Illness: Leslie Pittman is a 51 y.o. who identifies as a female who was assigned female at birth, and is being seen today for bacterial conjunctivitis.  HPI: HPI  Problems:  Patient Active Problem List   Diagnosis Date Noted   Left ovarian cyst 08/15/2018   Changing skin lesion 05/31/2018    Allergies:  Allergies  Allergen Reactions   Sulfa Antibiotics Anaphylaxis and Rash   Penicillins Other (See Comments)    Did it involve swelling of the face/tongue/throat, SOB, or low BP? Unknown Did it involve sudden or severe rash/hives, skin peeling, or any reaction on the inside of your mouth or nose? Unknown Did you need to seek medical attention at a hospital or doctor's office? Unknown When did it last happen?      childhood allergy If all above answers are "NO", may proceed with cephalosporin use.    Medications:  Current Outpatient Medications:    trimethoprim-polymyxin b (POLYTRIM) ophthalmic solution, Place 1 drop into  both eyes every 4 (four) hours for 7 days., Disp: 10 mL, Rfl: 0   amLODipine (NORVASC) 10 MG tablet, Take 10 mg by mouth every evening. , Disp: , Rfl:    Multiple Vitamin (MULTIVITAMIN WITH MINERALS) TABS tablet, Take 1 tablet by mouth daily., Disp: , Rfl:    Multiple Vitamins-Minerals (HAIR/SKIN/NAILS) TABS, Take 1 tablet by mouth daily., Disp: , Rfl:   Observations/Objective: Patient is well-developed, well-nourished in no acute  distress.  Resting comfortably at home.  Head is normocephalic, atraumatic.  No labored breathing.  Speech is clear and coherent with logical content.  Patient is alert and oriented at baseline.    Assessment and Plan: 1. Bacterial conjunctivitis of right eye - trimethoprim-polymyxin b (POLYTRIM) ophthalmic solution; Place 1 drop into both eyes every 4 (four) hours for 7 days.  Dispense: 10 mL; Refill: 0 Apply cold compresses to eye for relief of pain and irritation  Follow Up Instructions: I discussed the assessment and treatment plan with the patient. The patient was provided an opportunity to ask questions and all were answered. The patient agreed with the plan and demonstrated an understanding of the instructions.  A copy of instructions were sent to the patient via MyChart unless otherwise noted below.    The patient was advised to call back or seek an in-person evaluation if the symptoms worsen or if the condition fails to improve as anticipated.  Time:  I spent 11 minutes with the patient via telehealth technology discussing the above problems/concerns.    Gildardo Pounds, NP

## 2022-05-06 NOTE — Patient Instructions (Signed)
  Andrew Au, thank you for joining Gildardo Pounds, NP for today's virtual visit.  While this provider is not your primary care provider (PCP), if your PCP is located in our provider database this encounter information will be shared with them immediately following your visit.   [facility] account gives you access to today's visit and all your visits, tests, and labs performed at [facility] " click here if you don't have a [facility] account or go to [email or web address]  Consent: (Patient) Leslie Pittman provided verbal consent for this virtual visit at the beginning of the encounter.  Current Medications:  Current Outpatient Medications:    trimethoprim-polymyxin b (POLYTRIM) ophthalmic solution, Place 1 drop into both eyes every 4 (four) hours for 7 days., Disp: 10 mL, Rfl: 0   amLODipine (NORVASC) 10 MG tablet, Take 10 mg by mouth every evening. , Disp: , Rfl:    Multiple Vitamin (MULTIVITAMIN WITH MINERALS) TABS tablet, Take 1 tablet by mouth daily., Disp: , Rfl:    Multiple Vitamins-Minerals (HAIR/SKIN/NAILS) TABS, Take 1 tablet by mouth daily., Disp: , Rfl:    Medications ordered in this encounter:  Meds ordered this encounter  Medications   trimethoprim-polymyxin b (POLYTRIM) ophthalmic solution    Sig: Place 1 drop into both eyes every 4 (four) hours for 7 days.    Dispense:  10 mL    Refill:  0    Order Specific Question:   Supervising Provider    Answer:   Chase Picket [[id number]]     *If you need refills on other medications prior to your next appointment, please contact your pharmacy*  Follow-Up: Call back or seek an in-person evaluation if the symptoms worsen or if the condition fails to improve as anticipated.  [place] [phone]  Other Instructions Apply cold compresses to eye for relief of pain and irritation   If you have been instructed to have an in-person evaluation today at a local Urgent  Care facility, please use the link below. It will take you to a list of all of our available [facility], including address, phone number and hours of operation. Please do not delay care.  [facility]  If you or a family member do not have a primary care provider, use the link below to schedule a visit and establish care. When you choose a [facility] primary care physician or advanced practice provider, you gain a long-term partner in health. Find a Primary Care Provider  Learn more about [facility] in-office and virtual care options: [facility]

## 2022-11-08 ENCOUNTER — Other Ambulatory Visit: Payer: Self-pay | Admitting: Obstetrics and Gynecology

## 2022-11-08 DIAGNOSIS — Z1231 Encounter for screening mammogram for malignant neoplasm of breast: Secondary | ICD-10-CM

## 2023-01-29 ENCOUNTER — Ambulatory Visit
Admission: RE | Admit: 2023-01-29 | Discharge: 2023-01-29 | Disposition: A | Discharge status: Home / Self Care | Payer: BC Managed Care – PPO | Source: Ambulatory Visit | Attending: Obstetrics and Gynecology | Admitting: Obstetrics and Gynecology

## 2023-01-29 DIAGNOSIS — Z1231 Encounter for screening mammogram for malignant neoplasm of breast: Secondary | ICD-10-CM

## 2023-07-23 IMAGING — MG DIGITAL DIAGNOSTIC BILAT W/ TOMO W/ CAD
8 of 12 series · 8 of 32 positions shown · non-contrast
Comparison: Previous exam(s).

CLINICAL DATA: One year follow-up of left breast calcifications.



[L ML]
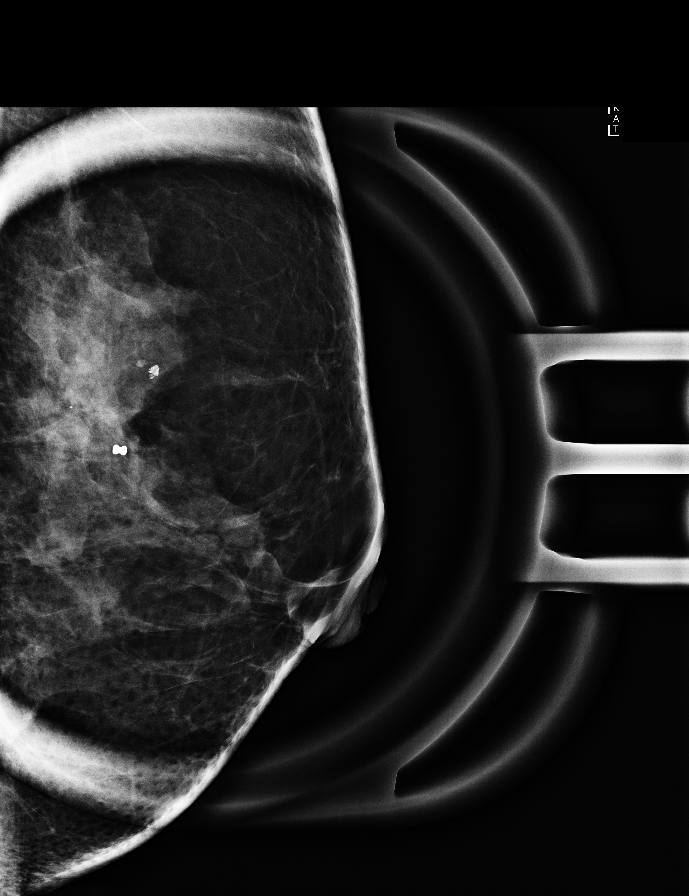

[L CC]
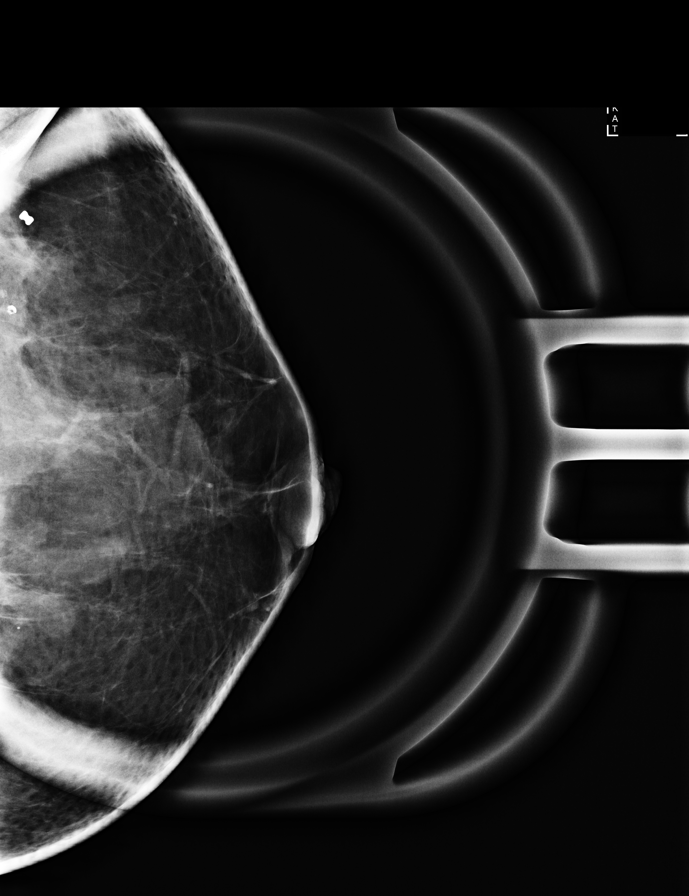

[L CC synth-2D]
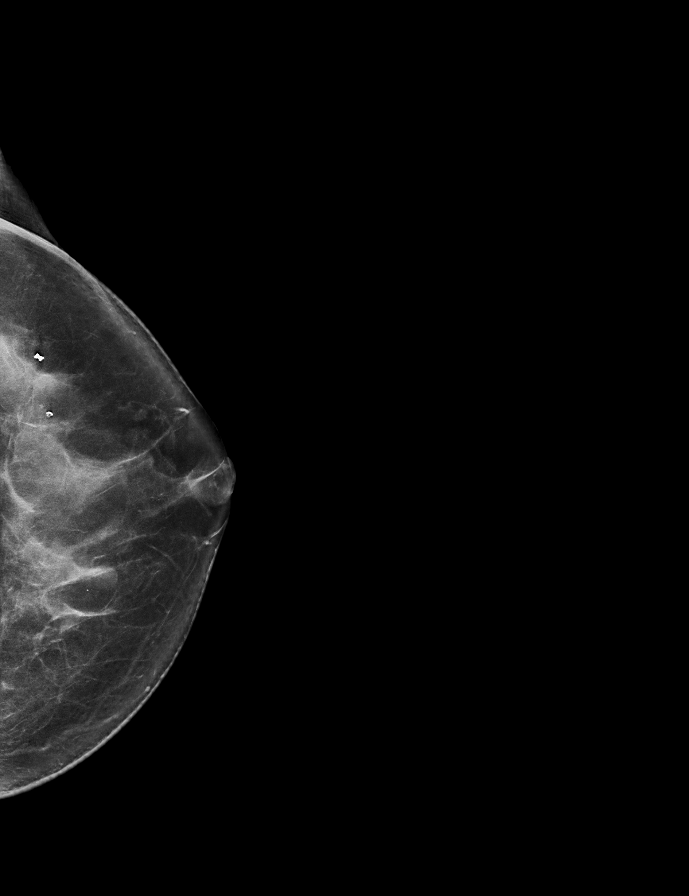

[L MLO synth-2D (1 of 2)]
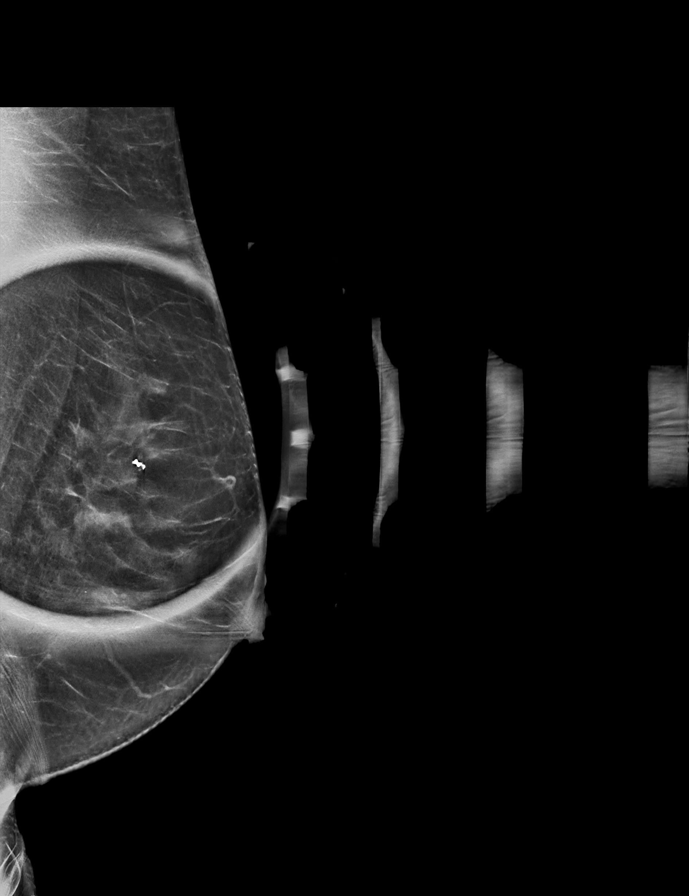

[R MLO synth-2D]
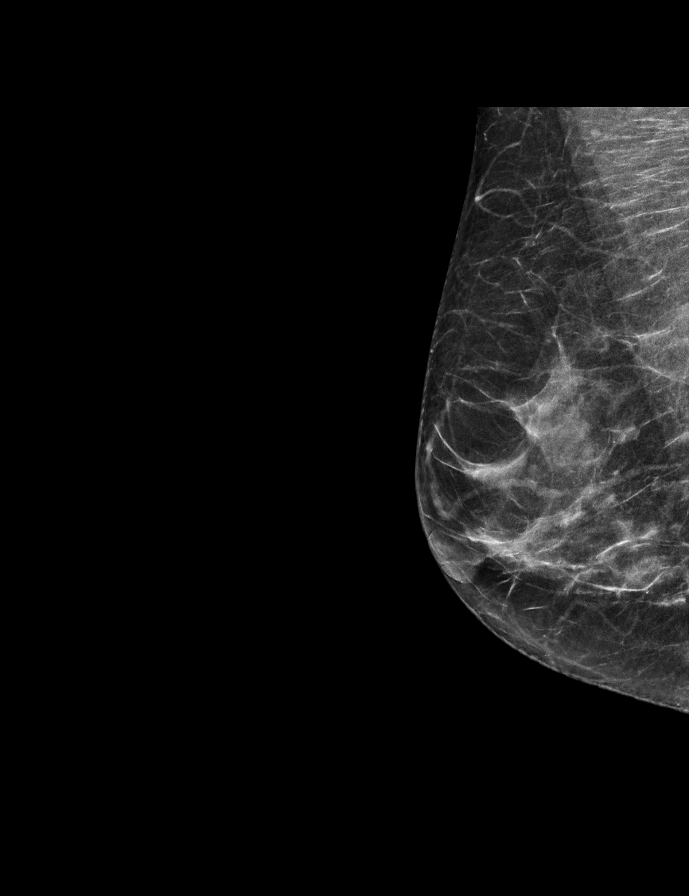

[L MLO synth-2D (2 of 2)]
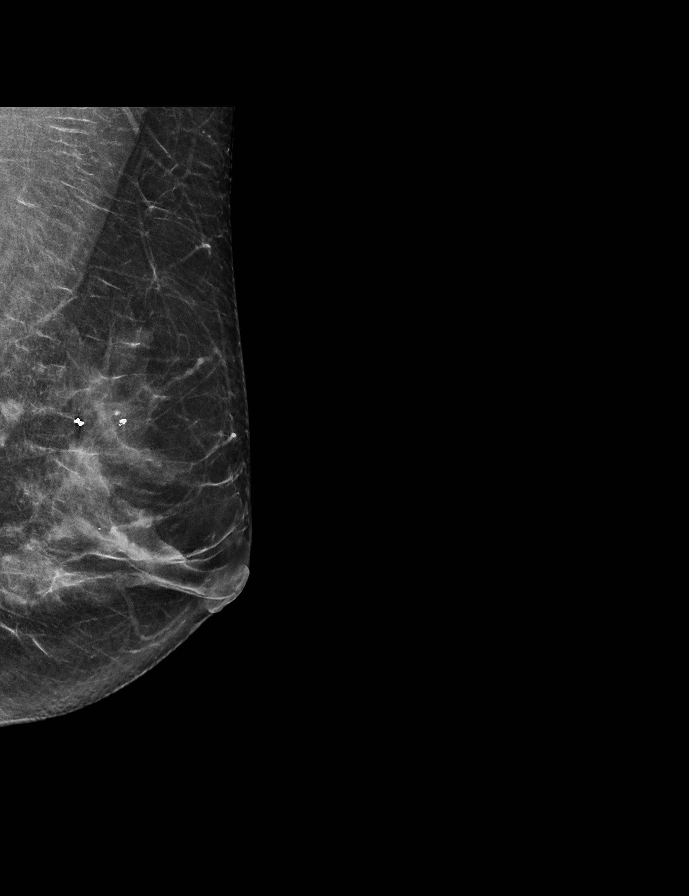

[R CC synth-2D]
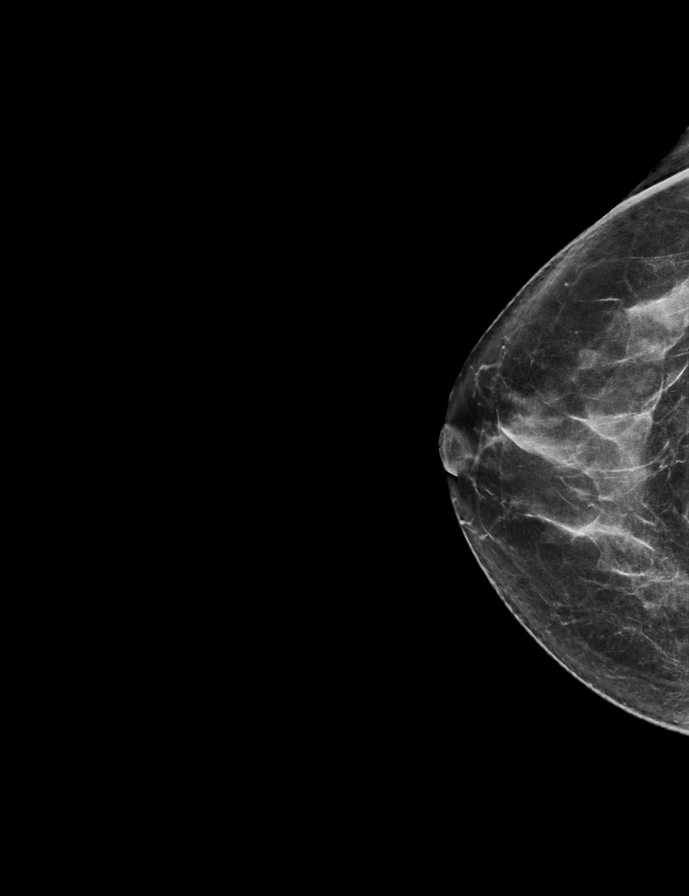

[R MLO tomo · tomo slice 27/54.0]
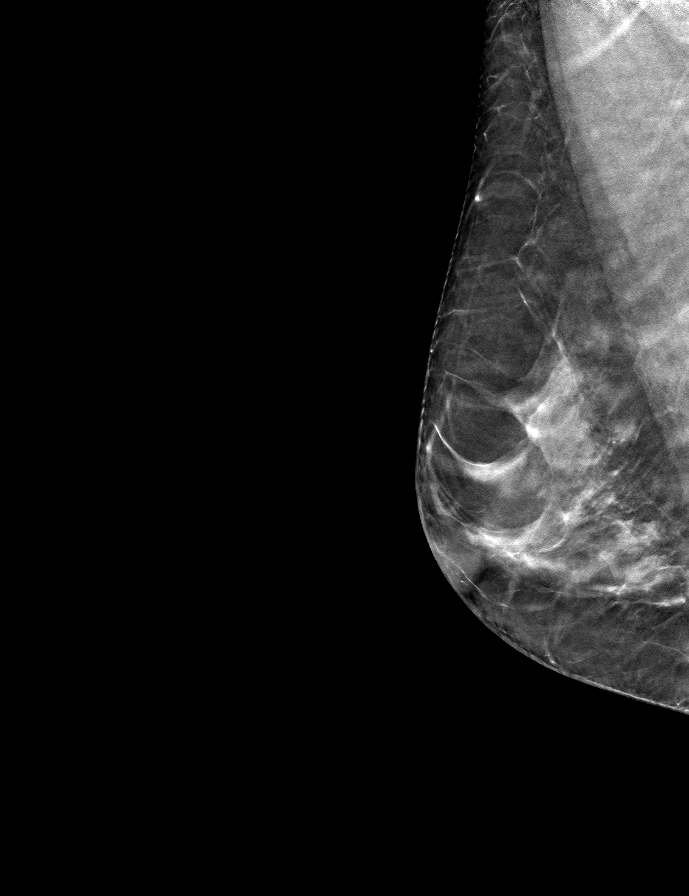

[8 of 32 positions shown; findings below may reference images not displayed]

ACR Breast Density Category c: The breast tissue is heterogeneously
dense, which may obscure small masses.
FINDINGS: Bilateral breast masses are identified. The left breast
calcifications have nearly resolved. No other suspicious findings in
either breast.

On physical exam, no suspicious lumps are identified.

Targeted ultrasound is performed, showing widespread fibrocystic
changes bilaterally accounting for the bilateral breast masses.
IMPRESSION: Fibrocystic changes. The previously identified probably benign
calcifications of nearly resolved, now considered benign. No other
suspicious findings.

RECOMMENDATION:
Annual screening mammography.

I have discussed the findings and recommendations with the patient.
If applicable, a reminder letter will be sent to the patient
regarding the next appointment.

BI-RADS CATEGORY  2: Benign.

## 2023-11-15 ENCOUNTER — Other Ambulatory Visit: Payer: Self-pay | Admitting: Obstetrics and Gynecology

## 2023-11-15 DIAGNOSIS — Z1231 Encounter for screening mammogram for malignant neoplasm of breast: Secondary | ICD-10-CM

## 2024-01-30 ENCOUNTER — Ambulatory Visit
Admission: RE | Admit: 2024-01-30 | Discharge: 2024-01-30 | Disposition: A | Discharge status: Home / Self Care | Payer: Self-pay | Source: Ambulatory Visit | Attending: Obstetrics and Gynecology | Admitting: Obstetrics and Gynecology

## 2024-01-30 DIAGNOSIS — Z1231 Encounter for screening mammogram for malignant neoplasm of breast: Secondary | ICD-10-CM

## 2024-03-17 ENCOUNTER — Encounter (HOSPITAL_COMMUNITY): Payer: Self-pay

## 2024-03-17 ENCOUNTER — Other Ambulatory Visit: Payer: Self-pay

## 2024-03-17 ENCOUNTER — Emergency Department (HOSPITAL_COMMUNITY)
Admission: EM | Admit: 2024-03-17 | Discharge: 2024-03-17 | Disposition: A | Discharge status: Home / Self Care | Attending: Emergency Medicine | Admitting: Emergency Medicine

## 2024-03-17 DIAGNOSIS — R42 Dizziness and giddiness: Secondary | ICD-10-CM | POA: Diagnosis not present

## 2024-03-17 DIAGNOSIS — R000 Tachycardia, unspecified: Secondary | ICD-10-CM | POA: Insufficient documentation

## 2024-03-17 DIAGNOSIS — R112 Nausea with vomiting, unspecified: Secondary | ICD-10-CM | POA: Insufficient documentation

## 2024-03-17 LAB — COMPREHENSIVE METABOLIC PANEL WITH GFR
ALT: 20 U/L (ref 0–44)
AST: 23 U/L (ref 15–41)
Albumin: 4.3 g/dL (ref 3.5–5.0)
Alkaline Phosphatase: 91 U/L (ref 38–126)
Anion gap: 14 (ref 5–15)
BUN: 17 mg/dL (ref 6–20)
CO2: 23 mmol/L (ref 22–32)
Calcium: 10.1 mg/dL (ref 8.9–10.3)
Chloride: 104 mmol/L (ref 98–111)
Creatinine, Ser: 0.42 mg/dL — ABNORMAL LOW (ref 0.44–1.00)
GFR, Estimated: >60 mL/min (ref >60)
Glucose, Bld: 93 mg/dL (ref 70–99)
Potassium: 4.1 mmol/L (ref 3.5–5.1)
Sodium: 141 mmol/L (ref 135–145)
Total Bilirubin: 0.5 mg/dL (ref 0.0–1.2)
Total Protein: 7.2 g/dL (ref 6.5–8.1)

## 2024-03-17 LAB — CBC
HCT: 42.9 % (ref 36.0–46.0)
Hemoglobin: 13.8 g/dL (ref 12.0–15.0)
MCH: 28 pg (ref 26.0–34.0)
MCHC: 32.2 g/dL (ref 30.0–36.0)
MCV: 87.2 fL (ref 80.0–100.0)
Platelets: 231 K/uL (ref 150–400)
RBC: 4.92 MIL/uL (ref 3.87–5.11)
RDW: 11.4 % — ABNORMAL LOW (ref 11.5–15.5)
WBC: 9.7 K/uL (ref 4.0–10.5)
nRBC: 0 % (ref 0.0–0.2)

## 2024-03-17 LAB — LIPASE, BLOOD: Lipase: 20 U/L (ref 11–51)

## 2024-03-17 MED ORDER — ONDANSETRON 4 MG PO TBDP: 4.0000 mg | ORAL_TABLET | Freq: Three times a day (TID) | ORAL | 0 refills | Status: AC | PRN

## 2024-03-17 MED ORDER — LACTATED RINGERS IV BOLUS
1000.0000 mL | Freq: Once | INTRAVENOUS | Status: AC
  Administered 2024-03-17: 1000 mL via INTRAVENOUS

## 2024-03-17 MED ORDER — ONDANSETRON HCL 4 MG/2ML IJ SOLN
4.0000 mg | Freq: Once | INTRAMUSCULAR | Status: AC
  Administered 2024-03-17: 4 mg via INTRAVENOUS
  Filled 2024-03-17: qty 2

## 2024-03-17 NOTE — ED Triage Notes (Addendum)
 Patient woke up this morning with nausea and dizziness. Had EKG at a clinic and was sent here for further follow up. Has had a lot of vomiting. No abdominal pain or chest pain.

## 2024-03-17 NOTE — Discharge Instructions (Addendum)
 Be sure to drink plenty of fluids.  Follow-up with your primary care provider in regards to your dizziness and nausea/vomiting.  If you develop new or worsening or recurrent symptoms then return to the ER.

## 2024-03-17 NOTE — ED Provider Notes (Signed)
 [place] EMERGENCY DEPARTMENT AT [facility] Provider Note   CSN: [id number] Arrival date & time: 03/17/24  1810     Patient presents with: Nausea   Leslie Pittman is a 53 y.o. female.   HPI 53 year old female presents with dizziness and vomiting.  When she first woke up around 5:30 in the morning she felt dizzy and off balance.  Shortly thereafter started having multiple episodes of vomiting that have lasted throughout the day.  She is finally feeling better over these last few hours.  The dizziness has steadily improved since initial onset.  Never felt lightheaded like passing out but more just off balance.  No ear pain or symptoms.  No headache, vision changes, focal weakness or numbness.  Went to urgent care this afternoon and because of her tachycardia she was sent to the ER.  At this point, when I am seeing her, she is now saying she is asymptomatic.  Patient also notes that whenever she goes to doctors office or hospital her heart rate tends to go up, she associates it like a whitecoat syndrome.  Prior to Admission medications   Medication Sig Start Date End Date Taking? Authorizing Provider  ondansetron  (ZOFRAN -ODT) 4 MG disintegrating tablet Take 1 tablet (4 mg total) by mouth every 8 (eight) hours as needed for nausea or vomiting. 03/17/24  Yes Freddi Hamilton, MD  amLODipine (NORVASC) 10 MG tablet Take 10 mg by mouth every evening.     [provider]  Multiple Vitamin (MULTIVITAMIN WITH MINERALS) TABS tablet Take 1 tablet by mouth daily.    [provider]  Multiple Vitamins-Minerals (HAIR/SKIN/NAILS) TABS Take 1 tablet by mouth daily.    [provider]    Allergies: Sulfa antibiotics, Cleocin  [clindamycin ], Clindamycin /lincomycin, and Penicillins    Review of Systems  Constitutional:  Negative for fever.  Eyes:  Negative for visual disturbance.  Respiratory:  Negative for shortness of breath.   Cardiovascular:  Negative for chest  pain.  Gastrointestinal:  Positive for nausea and vomiting.  Neurological:  Positive for dizziness. Negative for weakness, numbness and headaches.    Updated Vital Signs BP (!) 148/80   Pulse 100   Temp 98.1 F (36.7 C)   Resp 18   Ht 5' 1 (1.549 m)   Wt 52.2 kg   SpO2 100%   BMI 21.73 kg/m   Physical Exam Vitals and nursing note reviewed.  Constitutional:      General: She is not in acute distress.    Appearance: She is well-developed. She is not ill-appearing or diaphoretic.  HENT:     Head: Normocephalic and atraumatic.     Right Ear: Tympanic membrane normal.     Left Ear: Tympanic membrane normal.  Eyes:     Extraocular Movements: Extraocular movements intact.     Pupils: Pupils are equal, round, and reactive to light.  Cardiovascular:     Rate and Rhythm: Regular rhythm. Tachycardia present.     Heart sounds: Normal heart sounds.  Pulmonary:     Effort: Pulmonary effort is normal.     Breath sounds: Normal breath sounds.  Abdominal:     Palpations: Abdomen is soft.     Tenderness: There is no abdominal tenderness.  Skin:    General: Skin is warm and dry.  Neurological:     Mental Status: She is alert.     Comments: CN 3-12 grossly intact. 5/5 strength in all 4 extremities. Grossly normal sensation. Normal finger to nose. Normal  gait.     (all labs ordered are listed, but only abnormal results are displayed) Labs Reviewed  COMPREHENSIVE METABOLIC PANEL WITH GFR - Abnormal; Notable for the following components:      Result Value   Creatinine, Ser 0.42 (*)    All other components within normal limits  CBC - Abnormal; Notable for the following components:   RDW 11.4 (*)    All other components within normal limits  LIPASE, BLOOD  URINALYSIS, ROUTINE W REFLEX MICROSCOPIC    EKG: EKG Interpretation Date/Time:  Monday March 17 2024 18:20:19 EDT Ventricular Rate:  103 PR Interval:  143 QRS Duration:  94 QT Interval:  343 QTC Calculation: 449 R  Axis:   122  Text Interpretation: Sinus tachycardia Biatrial enlargement Right ventricular hypertrophy Baseline wander in lead(s) V6 Confirmed by Freddi Hamilton [phone]) on 03/17/2024 6:36:36 PM  Radiology: No results found.   Procedures   Medications Ordered in the ED  lactated ringers  bolus 1,000 mL (0 mLs Intravenous Stopped 03/17/24 2136)  lactated ringers  bolus 1,000 mL (0 mLs Intravenous Stopped 03/17/24 2136)  ondansetron  (ZOFRAN ) injection 4 mg (4 mg Intravenous Given 03/17/24 2020)                                    Medical Decision Making Amount and/or Complexity of Data Reviewed Labs: ordered.    Details: Unremarkable electrolytes ECG/medicine tests: ordered and independent interpretation performed.    Details: Sinus tachycardia  Risk Prescription drug management.   Patient presents with dizziness and vomiting.  Patient is now asymptomatic.  I think the tachycardia is probably a whitecoat problem as she truly seems to get tachycardic when a provider walks in the room.  Otherwise, heart rate is around 100.  Seems to be a sinus tachycardia.  She is otherwise well-appearing, has a benign neuro and abdominal exam, and is ambulating without difficulty.  Probably was vertigo though this seems to be resolved.  Low suspicion for TIA given no significant risk factors.  At this point, will give a dose of Zofran  for some recurrent nausea and a prescription for Zofran .  She feels well enough for discharge.  Was given some IV fluids.  Will discharge home with return precautions.     Final diagnoses:  Nausea and vomiting, unspecified vomiting type    ED Discharge Orders          Ordered    ondansetron  (ZOFRAN -ODT) 4 MG disintegrating tablet  Every 8 hours PRN        03/17/24 2047               Freddi Hamilton, MD 03/17/24 2208

## 2024-04-23 ENCOUNTER — Ambulatory Visit

## 2024-04-23 ENCOUNTER — Ambulatory Visit: Admitting: Podiatry

## 2024-04-23 VITALS — Ht 61.0 in | Wt 115.0 lb

## 2024-04-23 DIAGNOSIS — M79674 Pain in right toe(s): Secondary | ICD-10-CM

## 2024-04-23 DIAGNOSIS — M205X1 Other deformities of toe(s) (acquired), right foot: Secondary | ICD-10-CM | POA: Diagnosis not present

## 2024-04-24 ENCOUNTER — Encounter: Payer: Self-pay | Admitting: Podiatry

## 2024-04-24 NOTE — Progress Notes (Signed)
  Subjective:  Patient ID: Leslie Pittman, female    DOB: 20-Jan-1971,  MRN: [id number]  Chief Complaint  Patient presents with   Toe Pain    RM 4 Patient is here for pain and swelling of the right great toe. Pt states dropping cellular phone on right hallux three wks ago.Pt states inability to dorsiflex right hallux.    Discussed the use of AI scribe software for clinical note transcription with the patient, who gave verbal consent to proceed.  History of Present Illness Leslie Pittman is a 53 year old female who presents with right big toe pain and limited motion.  She has been experiencing right big toe pain for about three to four months, initially triggered by dropping a phone on it. The pain intensified significantly about three weeks ago after wearing high-heeled boots for an extended period.  She describes difficulty in bending the toe back and has noticed a change in her gait as she tries to protect the toe. No previous injuries to the toe are reported, and there are no issues with her left foot. She finds wearing sneakers more comfortable than high heels, although occasional pain persists.  She experiences muscle spasms in the area and has adjusted her gait to avoid pain. Since the onset of pain, she has been wearing tennis shoes and avoids high heels, opting for supportive footwear like sneakers and flip flops with good support.  She is a runner, broadcasting/film/video and frequently wears tennis shoes. She lives with her grandmother, who had similar foot issues.      Objective:    Physical Exam VASCULAR: DP and PT pulse palpable. Foot is warm and well-perfused. Capillary fill time is brisk. DERMATOLOGIC: Normal skin turgor, texture, and temperature. No open lesions, rashes, or ulcerations. NEUROLOGIC: Normal sensation to light touch and pressure. No paresthesias. ORTHOPEDIC: Limited range of motion of right first metatarsophalangeal joint with pain on dorsal first metatarsal head. Palpable bunion  and dorsal spur on right foot. Normal range of motion of left foot. No ecchymosis or bruising. No gross deformity. No pain to palpation.   No images are attached to the encounter.    Results RADIOLOGY Foot X-ray: Hallux limitus grade III, loss of cartilage, osteophyte formation, joint space narrowing, dorsal spur on first metatarsal head (04/23/2024)   Assessment:   1. Hallux limitus, right      Plan:  Patient was evaluated and treated and all questions answered.  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 Symptoms exacerbated by high heels and prolonged standing. X-rays show significant cartilage loss and bone spur formation. Conservative management preferred due to age and activity level. Surgery considered for long-term management if symptoms worsen. - Continue wearing supportive shoes with stiff soles, such as Brooks, [place], or Ultra. - Use orthotics to reduce motion under the big toe and provide support. - Take NSAIDs like ibuprofen  or Aleve for pain and inflammation as needed. - Avoid high heels and activities that exacerbate symptoms. - Scheduled follow-up with orthotist for custom orthotics. - Will consider steroid injection if symptoms worsen or if there is significant pain and swelling. - Will discuss potential surgical intervention, such as joint fusion, if conservative measures fail and symptoms significantly impact quality of life.      Return if symptoms worsen or fail to improve.

## 2024-05-14 ENCOUNTER — Ambulatory Visit: Admitting: Podiatrist

## 2024-05-14 DIAGNOSIS — M205X1 Other deformities of toe(s) (acquired), right foot: Secondary | ICD-10-CM | POA: Diagnosis not present

## 2024-05-14 DIAGNOSIS — M778 Other enthesopathies, not elsewhere classified: Secondary | ICD-10-CM | POA: Diagnosis not present

## 2024-05-14 DIAGNOSIS — M205X2 Other deformities of toe(s) (acquired), left foot: Secondary | ICD-10-CM

## 2024-05-14 NOTE — Progress Notes (Signed)
 Visit:  ORTHOTIC SCAN/ EVALUATION  Patient presented for evaluation/ scan for custom molded foot orthotics.  Patient will benefit from custom foot orthotics to provide total contact to bilateral medial longitudinal arches to help balance and distribute body weight more evenly.  Thus reducing plantar pressure and pain.   Orthotic will encourage forefoot and rearfoot alignment.    Patient was scanned today with OHI scanner.    Orthotics are ordered.  Signature obtained for notification of pricing/ fees for the device.  When the orthotic is ready for pick up, will call to make an appointment for a fitting.    Added a mortons extension right to address her hallux limitis.  Also did a starsuede topcover to fit in a casual shoe

## 2024-06-18 ENCOUNTER — Ambulatory Visit (INDEPENDENT_AMBULATORY_CARE_PROVIDER_SITE_OTHER): Admitting: Podiatrist

## 2024-06-18 DIAGNOSIS — M778 Other enthesopathies, not elsewhere classified: Secondary | ICD-10-CM

## 2024-06-18 DIAGNOSIS — M205X1 Other deformities of toe(s) (acquired), right foot: Secondary | ICD-10-CM

## 2024-06-18 NOTE — Progress Notes (Signed)
